# Patient Record
Sex: Female | Born: 1975
Health system: Southern US, Community
[De-identification: ages and names within clinical notes are randomized; demographics above are authoritative.]

## PROBLEM LIST (undated history)

## (undated) DIAGNOSIS — J309 Allergic rhinitis, unspecified: Secondary | ICD-10-CM

## (undated) HISTORY — DX: Allergic rhinitis, unspecified: J30.9

## (undated) HISTORY — PX: TUBAL LIGATION: SHX77

---

## 1998-01-22 ENCOUNTER — Other Ambulatory Visit: Admission: RE | Admit: 1998-01-22 | Discharge: 1998-01-22 | Payer: Self-pay | Admitting: *Deleted

## 1998-03-22 ENCOUNTER — Ambulatory Visit (HOSPITAL_COMMUNITY): Admission: RE | Admit: 1998-03-22 | Discharge: 1998-03-22 | Payer: Self-pay | Admitting: *Deleted

## 1998-06-19 ENCOUNTER — Inpatient Hospital Stay (HOSPITAL_COMMUNITY): Admission: AD | Admit: 1998-06-19 | Discharge: 1998-06-19 | Payer: Self-pay | Admitting: *Deleted

## 1998-06-20 ENCOUNTER — Inpatient Hospital Stay (HOSPITAL_COMMUNITY): Admission: AD | Admit: 1998-06-20 | Discharge: 1998-06-22 | Payer: Self-pay | Admitting: *Deleted

## 2013-03-18 ENCOUNTER — Ambulatory Visit: Payer: No Typology Code available for payment source | Attending: Family Medicine | Admitting: Family Medicine

## 2013-03-18 ENCOUNTER — Other Ambulatory Visit: Payer: Self-pay | Admitting: Family Medicine

## 2013-03-18 VITALS — BP 115/79 | HR 74 | Temp 98.4°F | Resp 16 | Wt 151.4 lb

## 2013-03-18 DIAGNOSIS — B3731 Acute candidiasis of vulva and vagina: Secondary | ICD-10-CM

## 2013-03-18 DIAGNOSIS — Z0000 Encounter for general adult medical examination without abnormal findings: Secondary | ICD-10-CM

## 2013-03-18 DIAGNOSIS — N920 Excessive and frequent menstruation with regular cycle: Secondary | ICD-10-CM

## 2013-03-18 DIAGNOSIS — Z7251 High risk heterosexual behavior: Secondary | ICD-10-CM

## 2013-03-18 DIAGNOSIS — B373 Candidiasis of vulva and vagina: Secondary | ICD-10-CM

## 2013-03-18 DIAGNOSIS — B379 Candidiasis, unspecified: Secondary | ICD-10-CM | POA: Insufficient documentation

## 2013-03-18 LAB — POCT GLYCOSYLATED HEMOGLOBIN (HGB A1C): Hemoglobin A1C: 5.6

## 2013-03-18 MED ORDER — FLUCONAZOLE 150 MG PO TABS: 150.0000 mg | ORAL_TABLET | Freq: Once | ORAL | Status: DC

## 2013-03-18 NOTE — Progress Notes (Signed)
Subjective:     Patient ID: Heidi Ellis, female   DOB: [birth year], 37 y.o.   MRN: [id number]  HPI Pt here to establish care and for health maintanance. She denies any health problems and takes no medications. Immigrated from [place] 16 years ago. G3P3 all NSVDs without complications.  She does report heavy periods - 4 day cycle q28 days, 2-3 days heavy with occasional clots. Significant cramps, mostly relieved by midol. Was on iron in the past for anemia, took for 1 year.   She has slight vaginal itching today, present for the past week. Has had before and treated at home with monistat. Seems to come on if she wears non-cotton underwear.   2 years ago divorced from her husband after finding out he was cheating on her. Would like to be checked for STI's. Has not had any pelvic pain or increased discharge. No other specific si/sx of STIs.   Pap - has gotten them roughly every 3 years, never had any abnormalities.   Not sure on immunization status - thinks they are utd from pregnancies but does not have records.   No FH of breast/ovarian/colon cancer.   Nonsmoker, nondrinker, no drug use.   Exercises sporadically, nothing regular.   Diet - minimal vegetables, otherwise all food groups, tries to "eat healthy"    Review of Systems  Respiratory: Negative for cough and shortness of breath.   Cardiovascular: Negative for chest pain.  Genitourinary: Positive for vaginal discharge. Negative for dysuria and vaginal bleeding.  Neurological: Negative for dizziness and headaches.        Objective:   Physical Exam  Nursing note and vitals reviewed. Constitutional: She appears well-developed and well-nourished.  HENT:  Right Ear: External ear normal.  Left Ear: External ear normal.  Mouth/Throat: Oropharynx is clear and moist.  Neck: Normal range of motion. Neck supple. No thyromegaly present.  Cardiovascular: Normal rate, regular rhythm and normal heart sounds.   Pulmonary/Chest: Effort  normal and breath sounds normal.  Abdominal: Soft. There is no tenderness.  Genitourinary: Uterus normal. Vaginal discharge found.  Skin: Skin is warm and dry.  Psychiatric: She has a normal mood and affect.       Assessment:     High risk sexual behavior - Plan: GC/Chlamydia Amp Probe, Genital, HIV Antibody, RPR, Acute Hep Panel & Hep B Surface Ab, Wet prep, genital  Heavy periods - Plan: CBC with Differential, Obtain medical records  Health care maintenance - Plan: CBC with Differential, Hemoglobin A1c, TSH, Vitamin D, 25-hydroxy, Comprehensive metabolic panel, Obtain medical records, Lipid Panel, PAP, Thin Prep w/HPV rflx HPV Type 16/18 ([facility])  Vaginal candida - Plan: fluconazole (DIFLUCAN) 150 MG tablet       Plan:     Potential exposure to STIs - wet prep, gc/chalm, hep, rpr, hiv today  Heavy periods - cbc, if anemic will get tvus to eval endo stripe and for fibroids. discussed ocps - she is s/p TL and in the past when she took ocps she has headaches and general malaise - prefers to avoid.   Candidiasis - diflucan x1  HCM - basic labs, obtain records for immunization status, pap with hpv today so as long as wnl will not need again for 5 years per curent guidelines. mammo starting at age 70, c-scope staring at age 56. Increase exercise.   rtc 1 year HCM exam, earlier if needed. Call with any questions or concerns.

## 2013-03-18 NOTE — Progress Notes (Signed)
Patient states her for her physical  And pap smear

## 2013-03-19 LAB — CBC WITH DIFFERENTIAL/PLATELET
Eosinophils Relative: 2 % (ref 0–5)
HCT: 32.7 % — ABNORMAL LOW (ref 36.0–46.0)
Lymphocytes Relative: 26 % (ref 12–46)
Lymphs Abs: 1.6 10*3/uL (ref 0.7–4.0)
MCV: 72.5 fL — ABNORMAL LOW (ref 78.0–100.0)
Monocytes Absolute: 0.4 10*3/uL (ref 0.1–1.0)
RBC: 4.51 MIL/uL (ref 3.87–5.11)
RDW: 17.5 % — ABNORMAL HIGH (ref 11.5–15.5)
WBC: 6.1 10*3/uL (ref 4.0–10.5)

## 2013-03-19 LAB — COMPREHENSIVE METABOLIC PANEL
Albumin: 3.9 g/dL (ref 3.5–5.2)
Alkaline Phosphatase: 59 U/L (ref 39–117)
BUN: 14 mg/dL (ref 6–23)
Glucose, Bld: 85 mg/dL (ref 70–99)
Total Bilirubin: 0.5 mg/dL (ref 0.3–1.2)

## 2013-03-19 LAB — RPR

## 2013-03-19 LAB — VITAMIN D 25 HYDROXY (VIT D DEFICIENCY, FRACTURES): Vit D, 25-Hydroxy: 36 ng/mL (ref 30–89)

## 2013-03-19 LAB — ACUTE HEP PANEL AND HEP B SURFACE AB
Hep B C IgM: NEGATIVE
Hep B S Ab: NONREACTIVE

## 2013-03-19 LAB — GC/CHLAMYDIA PROBE AMP: CT Probe RNA: NEGATIVE

## 2013-03-21 ENCOUNTER — Telehealth: Payer: Self-pay | Admitting: *Deleted

## 2013-03-21 ENCOUNTER — Other Ambulatory Visit: Payer: Self-pay | Admitting: Family Medicine

## 2013-03-21 DIAGNOSIS — D509 Iron deficiency anemia, unspecified: Secondary | ICD-10-CM

## 2013-03-21 LAB — WET PREP BY MOLECULAR PROBE: Trichomonas vaginosis: NEGATIVE

## 2013-03-21 NOTE — Telephone Encounter (Signed)
03/21/13 Patient made aware of lab results anemic doctor has ordered a pelic Ultrasound to evaluate her uteru per Dr. Lucretia Roers also start taking Ferrous Gluconate with orange juice you  Can increase fiber or use a stool   softner as iron may be constipating. Verbalize and understand P.[facility] BSN MHA

## 2013-03-22 ENCOUNTER — Encounter: Payer: Self-pay | Admitting: Family Medicine

## 2013-03-22 LAB — PAP, THIN PREP W/HPV RFLX HPV TYPE 16/18: HPV DNA High Risk: NOT DETECTED

## 2013-03-23 ENCOUNTER — Ambulatory Visit (HOSPITAL_COMMUNITY): Payer: No Typology Code available for payment source

## 2013-03-24 ENCOUNTER — Ambulatory Visit: Payer: No Typology Code available for payment source | Attending: Family Medicine

## 2013-03-24 DIAGNOSIS — Z0000 Encounter for general adult medical examination without abnormal findings: Secondary | ICD-10-CM

## 2013-03-24 LAB — LIPID PANEL
HDL: 44 mg/dL (ref >39)
LDL Cholesterol: 89 mg/dL (ref 0–99)
Triglycerides: 127 mg/dL (ref <150)
VLDL: 25 mg/dL (ref 0–40)

## 2013-03-25 ENCOUNTER — Ambulatory Visit (HOSPITAL_COMMUNITY): Payer: No Typology Code available for payment source

## 2013-03-25 ENCOUNTER — Encounter: Payer: Self-pay | Admitting: Family Medicine

## 2013-03-28 ENCOUNTER — Ambulatory Visit (HOSPITAL_COMMUNITY)
Admission: RE | Admit: 2013-03-28 | Discharge: 2013-03-28 | Disposition: A | Discharge status: Home / Self Care | Payer: No Typology Code available for payment source | Source: Ambulatory Visit | Attending: Family Medicine | Admitting: Family Medicine

## 2013-03-28 DIAGNOSIS — D649 Anemia, unspecified: Secondary | ICD-10-CM | POA: Insufficient documentation

## 2013-03-28 DIAGNOSIS — N920 Excessive and frequent menstruation with regular cycle: Secondary | ICD-10-CM | POA: Insufficient documentation

## 2013-03-28 DIAGNOSIS — R9389 Abnormal findings on diagnostic imaging of other specified body structures: Secondary | ICD-10-CM | POA: Insufficient documentation

## 2013-03-28 DIAGNOSIS — D509 Iron deficiency anemia, unspecified: Secondary | ICD-10-CM

## 2013-03-29 ENCOUNTER — Other Ambulatory Visit: Payer: Self-pay | Admitting: Family Medicine

## 2013-03-29 ENCOUNTER — Telehealth: Payer: Self-pay | Admitting: *Deleted

## 2013-03-29 DIAGNOSIS — R935 Abnormal findings on diagnostic imaging of other abdominal regions, including retroperitoneum: Secondary | ICD-10-CM

## 2013-03-29 NOTE — Telephone Encounter (Signed)
03/29/13 Unable to reach patient at number provideded. P.Faithe Ariola,RN

## 2013-04-26 ENCOUNTER — Telehealth: Payer: Self-pay | Admitting: Family Medicine

## 2013-04-26 NOTE — Telephone Encounter (Signed)
Pt left voicemail about dental referral, not sure when voicemail is from issue could be resolved.  Please f/u with patient.

## 2013-05-04 ENCOUNTER — Ambulatory Visit (INDEPENDENT_AMBULATORY_CARE_PROVIDER_SITE_OTHER): Payer: No Typology Code available for payment source | Admitting: Obstetrics & Gynecology

## 2013-05-04 ENCOUNTER — Other Ambulatory Visit (HOSPITAL_COMMUNITY)
Admission: RE | Admit: 2013-05-04 | Discharge: 2013-05-04 | Disposition: A | Discharge status: Home / Self Care | Payer: No Typology Code available for payment source | Source: Ambulatory Visit | Attending: Obstetrics & Gynecology | Admitting: Obstetrics & Gynecology

## 2013-05-04 ENCOUNTER — Encounter: Payer: Self-pay | Admitting: Obstetrics & Gynecology

## 2013-05-04 VITALS — BP 118/78 | HR 72 | Temp 98.0°F | Ht 63.0 in | Wt 151.1 lb

## 2013-05-04 DIAGNOSIS — N760 Acute vaginitis: Secondary | ICD-10-CM

## 2013-05-04 DIAGNOSIS — N920 Excessive and frequent menstruation with regular cycle: Secondary | ICD-10-CM | POA: Insufficient documentation

## 2013-05-04 DIAGNOSIS — N946 Dysmenorrhea, unspecified: Secondary | ICD-10-CM

## 2013-05-04 DIAGNOSIS — Z01812 Encounter for preprocedural laboratory examination: Secondary | ICD-10-CM

## 2013-05-04 LAB — POCT PREGNANCY, URINE: Preg Test, Ur: NEGATIVE

## 2013-05-04 MED ORDER — IBUPROFEN 800 MG PO TABS: 800.0000 mg | ORAL_TABLET | Freq: Three times a day (TID) | ORAL | Status: DC | PRN

## 2013-05-04 NOTE — Progress Notes (Signed)
Sent here for thickened endometrial lining. C/o heavy, painful periods. Also c/o may have yeast infection again- has them frequently.

## 2013-05-04 NOTE — Patient Instructions (Signed)
Menorragia (Menorrhagia) La hemorragia uterina (sangrado del tero) disfuncional es diferente del perodo menstrual normal. Cuando los perodos son irregulares o hay ms hemorragia que lo habitual en usted, se denomina menorragia. La causa puede ser un desequilibrio hormonal, fsico o metablico u otros problemas. Es necesario Medical sales representative examen para que el profesional pueda tratar [employer] causas que son tratables. Si el problema contina, ser necesario realizar una dilatacin y curetaje. Este procedimiento [employer] en dilatar el cuello del tero (la apertura del tero o matriz), es decir, se lo estira para Technical brewer, y se raspa la superficie que tapiza el interior del tero. Un especialista (patlogo) examina en el microscopio el tejido que se retira para asegurarse que no haya nada preocupante que requiera un examen ms exhaustivo. INSTRUCCIONES PARA EL CUIDADO DOMICILIARIO  Si el profesional que la asiste le prescribe medicamentos, tmelos tal como se le indic. No cambie ni reemplace medicamentos sin consultarlo con Mining engineer.  Las hemorragias de larga duracin pueden tener como consecuencia un dficit de hierro. El profesional que la asiste podr prescribirle comprimidos de hierro. Esto ayuda a Scientific laboratory technician que el organismo pierde luego de una hemorragia abundante. Tome los medicamentos tal como se le indic. El hierro puede causarle estreimiento. Si esto es un problema, aumente el consumo de [place], frutas y Leaf.  No tome aspirina o medicamentos que la contengan desde una semana antes del perodo menstrual ni durante el mismo. La aspirina puede hacer que la hemorragia empeore.  Si necesita cambiar el apsito o el tampn mas de una vez cada 2 horas, permanezca en cama y descanse todo lo posible hasta que la hemorragia se detenga.  Consuma alimentos balanceados. Coma alimentos ricos en hierro. Por ejemplo, verduras de [employer], carne, hgado, huevos y panes y Medical laboratory scientific officer de  grano integral. No trate de perder peso hasta que la hemorragia anormal se detenga y los niveles de hierro en la sangre vuelvan a la normalidad. SOLICITE ATENCIN MDICA SI:  Debe cambiar el apsito o el tampn ms de una vez cada hora.  Si tiene nuseas (ganas de vomitar), vmitos, mareos o diarrea mientras toma los medicamentos.  Tiene algn problema que pueda relacionarse con el medicamento que est tomando. SOLICITE ATENCIN MDICA DE INMEDIATO SI:  Tiene fiebre.  Siente escalofros.  Sufre una hemorragia intensa o comienza a eliminar cogulos de [place].  Se siente mareado o sufre un desmayo. EST SEGURO QUE:   Comprende las instrucciones para el alta mdica.  Controlar su enfermedad.  Solicitar atencin mdica de inmediato segn las indicaciones. Document Released: 07/09/2005 Document Revised: 12/22/2011 [facility] Patient Information 2014 [place], [place].

## 2013-05-05 LAB — WET PREP, GENITAL
Clue Cells Wet Prep HPF POC: NONE SEEN
Yeast Wet Prep HPF POC: NONE SEEN

## 2013-05-05 NOTE — Progress Notes (Signed)
GYNECOLOGY CLINIC PROGRESS NOTE  History:  37 y.o. G3P3000 here today for evaluation of abnormal endometrial stripe seen on ultrasound during workup for menorrhagia; see report below.  Reports menorrhagia for several months, no symptoms of anemia.  She also reports having pruritic vaginal discharge.  No other GYN concerns.   03/21/13 TRANSABDOMINAL AND TRANSVAGINAL ULTRASOUND OF PELVIS Clinical Data: Anemia. Menorrhagia.  Comparison: None  Findings: Uterus: 8.4 x 4.6 x 5.3 cm. Endometrium: 6.5 mm in thickness. The endometrium is irregular and heterogeneous. There are echogenic foci in the fundal endometrium. Right ovary: Normal appearance/no adnexal mass.  Left ovary: Could not be visualized. Other findings: No free fluid IMPRESSION: Left ovary could not be visualized. The endometrium is abnormal. It is heterogeneous with calcified foci. Underlying calcified lesion cannot be excluded. Sonohysterogram or MRI may be helpful.   The following portions of the patient's history were reviewed and updated as appropriate: allergies, current medications, past family history, past medical history, past social history, past surgical history and problem list.  Review of Systems:  Pertinent items are noted in HPI.  Objective:  Physical Exam BP 118/78  Pulse 72  Temp(Src) 98 F (36.7 C)  Ht 5\' 3"  (1.6 m)  Wt 151 lb 1.6 oz (68.539 kg)  BMI 26.77 kg/m2  LMP 04/20/2013 Gen: NAD Abd: Soft, nontender and nondistended Pelvic: Normal appearing external genitalia; normal appearing vaginal mucosa and cervix.  Scant white discharge seen, sample obtained for wet prep.  Small uterus, no other palpable masses, no uterine or adnexal tenderness  ENDOMETRIAL BIOPSY     The indications for endometrial biopsy were reviewed.   Risks of the biopsy including cramping, bleeding, infection, uterine perforation, inadequate specimen and need for additional procedures  were discussed. The patient states she understands and agrees  to undergo procedure today. Consent was signed. Time out was performed. Urine HCG was negative. During the pelvic exam, the cervix was prepped with Betadine.  The 3 mm pipelle was introduced into the endometrial cavity without difficulty to a depth of 8 cm, and a moderate amount of tissue was obtained and sent to pathology. The instruments were removed from the patient's vagina. Minimal bleeding from the cervix was noted. The patient tolerated the procedure well. Routine post-procedure instructions were given to the patient.    Assessment & Plan:  The patient will be called with the results of the wet prep and endometrial biopsy and given recommendations for further management.   Bleeding precautions reviewed. Routine preventative health maintenance measures emphasized.

## 2013-05-06 ENCOUNTER — Telehealth: Payer: Self-pay | Admitting: General Practice

## 2013-05-06 NOTE — Telephone Encounter (Signed)
Called patient and informed her of normal results. Patient verbalized understanding and had no further questions  

## 2013-05-06 NOTE — Telephone Encounter (Signed)
Message copied by Kathee Delton on Fri May 06, 2013  9:36 AM ------      Message from: Jaynie Collins A      Created: Thu May 05, 2013  4:16 PM       Normal wet prep. No need for treatment. Please call to inform patient of results.       ------

## 2013-05-09 ENCOUNTER — Telehealth: Payer: Self-pay | Admitting: General Practice

## 2013-05-09 NOTE — Telephone Encounter (Signed)
Message copied by Kathee Delton on Mon May 09, 2013  1:02 PM ------      Message from: Jaynie Collins A      Created: Sat May 07, 2013  9:11 AM       Benign endometrial biopsy. Please call to inform patient of results. ------

## 2013-05-09 NOTE — Telephone Encounter (Signed)
Called patient, went straight to VM- left message stating we are calling with some information and to call us back at the clinics

## 2013-05-11 NOTE — Telephone Encounter (Signed)
Pt informed of results.

## 2013-10-06 ENCOUNTER — Emergency Department (HOSPITAL_COMMUNITY)
Admission: EM | Admit: 2013-10-06 | Discharge: 2013-10-06 | Disposition: A | Discharge status: Home / Self Care | Payer: Self-pay | Attending: Emergency Medicine | Admitting: Emergency Medicine

## 2013-10-06 ENCOUNTER — Encounter (HOSPITAL_COMMUNITY): Payer: Self-pay | Admitting: Emergency Medicine

## 2013-10-06 DIAGNOSIS — R5381 Other malaise: Secondary | ICD-10-CM | POA: Insufficient documentation

## 2013-10-06 DIAGNOSIS — R6889 Other general symptoms and signs: Secondary | ICD-10-CM

## 2013-10-06 DIAGNOSIS — R110 Nausea: Secondary | ICD-10-CM | POA: Insufficient documentation

## 2013-10-06 DIAGNOSIS — J069 Acute upper respiratory infection, unspecified: Secondary | ICD-10-CM

## 2013-10-06 DIAGNOSIS — R000 Tachycardia, unspecified: Secondary | ICD-10-CM | POA: Insufficient documentation

## 2013-10-06 DIAGNOSIS — R51 Headache: Secondary | ICD-10-CM | POA: Insufficient documentation

## 2013-10-06 DIAGNOSIS — R6883 Chills (without fever): Secondary | ICD-10-CM | POA: Insufficient documentation

## 2013-10-06 MED ORDER — OSELTAMIVIR PHOSPHATE 75 MG PO CAPS: 75.0000 mg | ORAL_CAPSULE | Freq: Two times a day (BID) | ORAL | Status: DC

## 2013-10-06 NOTE — ED Provider Notes (Signed)
CSN: [id number]     Arrival date & time 10/06/13  1316 History  This chart was scribed for non-physician practitioner, Johnnette Gourd, PA-C working with Glynn Octave, MD by Greggory Stallion, ED scribe. This patient was seen in room TR08C/TR08C and the patient's care was started at 1:32 PM.   Chief Complaint  Patient presents with  . Generalized Body Aches   The history is provided by the patient. No language interpreter was used.   HPI Comments: Heidi Ellis is a 37 y.o. female who presents to the Emergency Department complaining of generalized body aches, cough, congestion, headache and chills that started 2 days ago. She has also had mild nausea. Pt has taken Mucinex with temporary relief of headache. Denies fever, emesis. Denies sick contacts. Pt has not gotten a flu shot this year.   History reviewed. No pertinent past medical history. History reviewed. No pertinent past surgical history. History reviewed. No pertinent family history. History  Substance Use Topics  . Smoking status: Never Smoker   . Smokeless tobacco: Never Used  . Alcohol Use: No   OB History   Grav Para Term Preterm Abortions TAB SAB Ect Mult Living   3 3 3             Review of Systems  Constitutional: Positive for chills and fatigue. Negative for fever.  HENT: Positive for congestion.   Respiratory: Positive for cough.   Gastrointestinal: Positive for nausea. Negative for vomiting.  Neurological: Positive for headaches.  All other systems reviewed and are negative.    Allergies  Review of patient's allergies indicates no known allergies.  Home Medications   Current Outpatient Rx  Name  Route  Sig  Dispense  Refill  . guaiFENesin (MUCINEX) 600 MG 12 hr tablet   Oral   Take 1,200 mg by mouth 2 (two) times daily as needed for cough.         Marland Kitchen oseltamivir (TAMIFLU) 75 MG capsule   Oral   Take 1 capsule (75 mg total) by mouth every 12 (twelve) hours.   10 capsule   0    BP 124/81  Pulse  112  Temp(Src) 98.3 F (36.8 C) (Oral)  Resp 24  SpO2 97%  Physical Exam  Nursing note and vitals reviewed. Constitutional: She is oriented to person, place, and time. She appears well-developed and well-nourished. No distress.  Nasal congestion.   HENT:  Head: Normocephalic and atraumatic.  Nose: Mucosal edema present. Right sinus exhibits maxillary sinus tenderness. Right sinus exhibits no frontal sinus tenderness. Left sinus exhibits maxillary sinus tenderness. Left sinus exhibits no frontal sinus tenderness.  Mouth/Throat: Oropharynx is clear and moist.  Eyes: Conjunctivae and EOM are normal.  Neck: Normal range of motion. Neck supple.  Cardiovascular: Regular rhythm and normal heart sounds.  Tachycardia present.   Pulmonary/Chest: Effort normal and breath sounds normal. No respiratory distress. She has no wheezes. She has no rhonchi. She has no rales.  Musculoskeletal: Normal range of motion. She exhibits no edema.  Neurological: She is alert and oriented to person, place, and time. No sensory deficit.  Skin: Skin is warm and dry.  Psychiatric: She has a normal mood and affect. Her behavior is normal.    ED Course  Procedures (including critical care time)  DIAGNOSTIC STUDIES: Oxygen Saturation is 97% on RA, normal by my interpretation.    COORDINATION OF CARE: 1:35 PM-Discussed treatment plan which includes continuing Mucinex and ibuprofen with pt at bedside and pt agreed to plan.  Labs Review Labs Reviewed - No data to display Imaging Review No results found.  EKG Interpretation   None       MDM   1. Flu-like symptoms   2. URI (upper respiratory infection)    Patient presents with flulike symptoms. She is well appearing and in no apparent distress. Tachycardic around 112. Given symptoms have been present for less than 72 hours, will start Tamiflu. Symptomatic treatment discussed. Return precautions given. Patient states understanding of treatment care plan  and is agreeable.  I personally performed the services described in this documentation, which was scribed in my presence. The recorded information has been reviewed and is accurate.   Trevor Mace, PA-C 10/06/13 1342

## 2013-10-06 NOTE — ED Notes (Signed)
Pt from home, c/o body aches, head congestion and chills x 2 days

## 2013-10-07 NOTE — ED Provider Notes (Signed)
Medical screening examination/treatment/procedure(s) were performed by non-physician practitioner and as supervising physician I was immediately available for consultation/collaboration.  EKG Interpretation   None        Magdelena Kinsella, MD 10/07/13 1000 

## 2013-11-21 ENCOUNTER — Ambulatory Visit: Payer: No Typology Code available for payment source | Attending: Internal Medicine

## 2013-11-21 ENCOUNTER — Ambulatory Visit: Payer: No Typology Code available for payment source | Attending: Internal Medicine | Admitting: Internal Medicine

## 2013-11-21 VITALS — BP 143/75 | HR 90 | Temp 98.9°F | Resp 14 | Ht 63.0 in | Wt 151.2 lb

## 2013-11-21 DIAGNOSIS — D649 Anemia, unspecified: Secondary | ICD-10-CM | POA: Insufficient documentation

## 2013-11-21 DIAGNOSIS — N920 Excessive and frequent menstruation with regular cycle: Secondary | ICD-10-CM | POA: Insufficient documentation

## 2013-11-21 DIAGNOSIS — R12 Heartburn: Secondary | ICD-10-CM | POA: Insufficient documentation

## 2013-11-21 DIAGNOSIS — K5900 Constipation, unspecified: Secondary | ICD-10-CM | POA: Insufficient documentation

## 2013-11-21 DIAGNOSIS — N946 Dysmenorrhea, unspecified: Secondary | ICD-10-CM | POA: Insufficient documentation

## 2013-11-21 DIAGNOSIS — J309 Allergic rhinitis, unspecified: Secondary | ICD-10-CM

## 2013-11-21 LAB — CBC WITH DIFFERENTIAL/PLATELET
Basophils Absolute: 0.1 10*3/uL (ref 0.0–0.1)
Basophils Relative: 1 % (ref 0–1)
EOS ABS: 0.1 10*3/uL (ref 0.0–0.7)
EOS PCT: 2 % (ref 0–5)
HEMATOCRIT: 36.7 % (ref 36.0–46.0)
Hemoglobin: 12.1 g/dL (ref 12.0–15.0)
LYMPHS ABS: 1.1 10*3/uL (ref 0.7–4.0)
LYMPHS PCT: 21 % (ref 12–46)
MCH: 28.1 pg (ref 26.0–34.0)
MCHC: 33 g/dL (ref 30.0–36.0)
MCV: 85.2 fL (ref 78.0–100.0)
MONO ABS: 0.4 10*3/uL (ref 0.1–1.0)
MONOS PCT: 7 % (ref 3–12)
Neutro Abs: 3.4 10*3/uL (ref 1.7–7.7)
Neutrophils Relative %: 69 % (ref 43–77)
PLATELETS: 288 10*3/uL (ref 150–400)
RBC: 4.31 MIL/uL (ref 3.87–5.11)
RDW: 14.6 % (ref 11.5–15.5)
WBC: 5 10*3/uL (ref 4.0–10.5)

## 2013-11-21 LAB — COMPLETE METABOLIC PANEL WITH GFR
ALT: 8 U/L (ref 0–35)
AST: 11 U/L (ref 0–37)
Albumin: 3.8 g/dL (ref 3.5–5.2)
Alkaline Phosphatase: 51 U/L (ref 39–117)
BILIRUBIN TOTAL: 0.6 mg/dL (ref 0.2–1.2)
BUN: 11 mg/dL (ref 6–23)
CALCIUM: 8.8 mg/dL (ref 8.4–10.5)
CHLORIDE: 105 meq/L (ref 96–112)
CO2: 25 meq/L (ref 19–32)
CREATININE: 0.66 mg/dL (ref 0.50–1.10)
GLUCOSE: 87 mg/dL (ref 70–99)
Potassium: 4 mEq/L (ref 3.5–5.3)
Sodium: 139 mEq/L (ref 135–145)
Total Protein: 6.5 g/dL (ref 6.0–8.3)

## 2013-11-21 LAB — LIPID PANEL
Cholesterol: 172 mg/dL (ref 0–200)
HDL: 42 mg/dL (ref >39)
LDL CALC: 92 mg/dL (ref 0–99)
TRIGLYCERIDES: 190 mg/dL — AB (ref <150)
Total CHOL/HDL Ratio: 4.1 Ratio
VLDL: 38 mg/dL (ref 0–40)

## 2013-11-21 LAB — TSH: TSH: 0.634 u[IU]/mL (ref 0.350–4.500)

## 2013-11-21 MED ORDER — LORATADINE 10 MG PO TABS: 10.0000 mg | ORAL_TABLET | Freq: Every day | ORAL | Status: DC

## 2013-11-21 MED ORDER — POLYETHYLENE GLYCOL 3350 17 GM/SCOOP PO POWD: 17.0000 g | Freq: Two times a day (BID) | ORAL | Status: DC | PRN

## 2013-11-21 MED ORDER — AMOXICILLIN-POT CLAVULANATE 875-125 MG PO TABS: 1.0000 | ORAL_TABLET | Freq: Two times a day (BID) | ORAL | Status: DC

## 2013-11-21 MED ORDER — PANTOPRAZOLE SODIUM 40 MG PO TBEC: 40.0000 mg | DELAYED_RELEASE_TABLET | Freq: Every day | ORAL | Status: DC

## 2013-11-21 NOTE — Progress Notes (Signed)
Pt is here for an office visit. Pt requests lab results from last visit, a vitamin D check, and a dentist referral. Complains of seasonal allergies; runny nose, sore throat, headaches. Requests medication for allergies.

## 2013-11-21 NOTE — Progress Notes (Signed)
Patient ID: Heidi Ellis, female   DOB: 1975/11/07, 38 y.o.   MRN: [id number]   CC:  HPI: 38 year old female here to establish care. The patient states that she is anemic and takes iron pills. She has irregular menstrual cycles with heavy flow on the first couple of days of her period. She had a Pap smear, endometrial biopsy which was negative in June of 2014. She also is requesting a dentist referral because of pain in her right molar She also complains of seasonal allergies and runny nose teary eyes, occasional cough She also states that she is constipated and has a BM every other day, feels bloated and has heartburn. She takes Gas-X over-the-counter for heartburn    No Known Allergies No past medical history on file. Current Outpatient Prescriptions on File Prior to Visit  Medication Sig Dispense Refill  . guaiFENesin (MUCINEX) 600 MG 12 hr tablet Take 1,200 mg by mouth 2 (two) times daily as needed for cough.       No current facility-administered medications on file prior to visit.   No family history on file. History   Social History  . Marital Status: Single    Spouse Name: N/A    Number of Children: N/A  . Years of Education: N/A   Occupational History  . Not on file.   Social History Main Topics  . Smoking status: Never Smoker   . Smokeless tobacco: Never Used  . Alcohol Use: No  . Drug Use: No  . Sexual Activity: Not Currently    Birth Control/ Protection: None   Other Topics Concern  . Not on file   Social History Narrative  . No narrative on file    Review of Systems  Constitutional: As in history of present illness  HENT: Negative for ear pain, nosebleeds, congestion, facial swelling, rhinorrhea, neck pain, neck stiffness and ear discharge.   Eyes: Negative for pain, discharge, redness, itching and visual disturbance.  Respiratory: Negative for cough, choking, chest tightness, shortness of breath, wheezing and stridor.   Cardiovascular: Negative  for chest pain, palpitations and leg swelling.  Gastrointestinal: As in history of present illness Genitourinary: Negative for dysuria, urgency, frequency, hematuria, flank pain, decreased urine volume, difficulty urinating and dyspareunia.  Musculoskeletal: Negative for back pain, joint swelling, arthralgias and gait problem.  Neurological: Negative for dizziness, tremors, seizures, syncope, facial asymmetry, speech difficulty, weakness, light-headedness, numbness and headaches.  Hematological: Negative for adenopathy. Does not bruise/bleed easily.  Psychiatric/Behavioral: Negative for hallucinations, behavioral problems, confusion, dysphoric mood, decreased concentration and agitation.    Objective:   Filed Vitals:   11/21/13 0940  BP: 143/75  Pulse: 90  Temp: 98.9 F (37.2 C)  Resp: 14    Physical Exam  Constitutional: Appears well-developed and well-nourished. No distress.  HENT: Normocephalic. External right and left ear normal. Oropharynx is clear and moist.  Eyes: Conjunctivae and EOM are normal. PERRLA, no scleral icterus.  Neck: Normal ROM. Neck supple. No JVD. No tracheal deviation. No thyromegaly.  CVS: RRR, S1/S2 +, no murmurs, no gallops, no carotid bruit.  Pulmonary: Effort and breath sounds normal, no stridor, rhonchi, wheezes, rales.  Abdominal: Soft. BS +,  no distension, tenderness, rebound or guarding.  Musculoskeletal: Normal range of motion. No edema and no tenderness.  Lymphadenopathy: No lymphadenopathy noted, cervical, inguinal. Neuro: Alert. Normal reflexes, muscle tone coordination. No cranial nerve deficit. Skin: Skin is warm and dry. No rash noted. Not diaphoretic. No erythema. No pallor.  Psychiatric: Normal mood and affect.  Behavior, judgment, thought content normal.   Lab Results  Component Value Date   WBC 6.1 03/18/2013   HGB 10.1* 03/18/2013   HCT 32.7* 03/18/2013   MCV 72.5* 03/18/2013   PLT 316 03/18/2013   Lab Results  Component Value Date    CREATININE 0.72 03/18/2013   BUN 14 03/18/2013   NA 135 03/18/2013   K 4.3 03/18/2013   CL 104 03/18/2013   CO2 25 03/18/2013    Lab Results  Component Value Date   HGBA1C 5.6 % 03/18/2013   Lipid Panel     Component Value Date/Time   CHOL 158 03/24/2013 1006   TRIG 127 03/24/2013 1006   HDL 44 03/24/2013 1006   CHOLHDL 3.6 03/24/2013 1006   VLDL 25 03/24/2013 1006   LDLCALC 89 03/24/2013 1006       Assessment and plan:   Patient Active Problem List   Diagnosis Date Noted  . Dysmenorrhea 05/04/2013  . Menorrhagia 05/04/2013   Allergic rhinitis Prescribed the patient Claritin If symptoms persist. The patient on Flonase   Dentist referral Probably has a chipped molar We'll start the patient on Augmentin before her appointment   Constipation We'll check TSH, start the patient on MiraLAX Start The patient on Protonix for heartburn  Establish care Obtain lipid panel, CBC Due for Pap smear in June of 2015 Continue iron pills for anemia   Follow up in 2 to 3 months         The patient was given clear instructions to go to ER or return to medical center if symptoms don't improve, worsen or new problems develop. The patient verbalized understanding. The patient was told to call to get any lab results if not heard anything in the next week.

## 2013-11-22 ENCOUNTER — Telehealth: Payer: Self-pay | Admitting: *Deleted

## 2013-11-22 LAB — VITAMIN D 25 HYDROXY (VIT D DEFICIENCY, FRACTURES): Vit D, 25-Hydroxy: 38 ng/mL (ref 30–89)

## 2013-11-22 NOTE — Telephone Encounter (Signed)
Message copied by Jamyrah Saur, [place] on Tue Nov 22, 2013  4:06 PM ------      Message from: Susie CassetteABROL MD, Germain OsgoodNAYANA      Created: Tue Nov 22, 2013  2:59 PM       Notify patient that all labs are normal ------

## 2013-12-27 ENCOUNTER — Encounter (HOSPITAL_COMMUNITY): Payer: Self-pay | Admitting: Emergency Medicine

## 2013-12-27 ENCOUNTER — Emergency Department (HOSPITAL_COMMUNITY)
Admission: EM | Admit: 2013-12-27 | Discharge: 2013-12-27 | Disposition: A | Discharge status: Home / Self Care | Payer: No Typology Code available for payment source | Source: Home / Self Care | Attending: Family Medicine | Admitting: Family Medicine

## 2013-12-27 DIAGNOSIS — R519 Headache, unspecified: Secondary | ICD-10-CM

## 2013-12-27 DIAGNOSIS — J069 Acute upper respiratory infection, unspecified: Secondary | ICD-10-CM

## 2013-12-27 DIAGNOSIS — Z889 Allergy status to unspecified drugs, medicaments and biological substances status: Secondary | ICD-10-CM

## 2013-12-27 DIAGNOSIS — R51 Headache: Secondary | ICD-10-CM

## 2013-12-27 DIAGNOSIS — B9789 Other viral agents as the cause of diseases classified elsewhere: Principal | ICD-10-CM

## 2013-12-27 MED ORDER — FLUTICASONE PROPIONATE 50 MCG/ACT NA SUSP: 1.0000 | Freq: Every day | NASAL | Status: DC

## 2013-12-27 MED ORDER — OLOPATADINE HCL 0.2 % OP SOLN: 1.0000 [drp] | Freq: Every day | OPHTHALMIC | Status: DC

## 2013-12-27 MED ORDER — IPRATROPIUM BROMIDE 0.06 % NA SOLN: 2.0000 | Freq: Four times a day (QID) | NASAL | Status: DC

## 2013-12-27 NOTE — ED Provider Notes (Signed)
CSN: [id number]     Arrival date & time 12/27/13  1548 History   First MD Initiated Contact with Patient 12/27/13 1708     Chief Complaint  Patient presents with  . Otalgia  . Sore Throat   (Consider location/radiation/quality/duration/timing/severity/associated sxs/prior Treatment) HPI  URI: present for 6 wks. Given augmentin at [facility]. Did not improve symptoms. Associated w/ watery eyes, sinus congestion, HA, and full ear sensation. Denies fever, vomiting, diarrhea. Denies allergies. No change in symptoms. claritin w/ some benefit. Worse at night.   History reviewed. No pertinent past medical history. History reviewed. No pertinent past surgical history. History reviewed. No pertinent family history. History  Substance Use Topics  . Smoking status: Never Smoker   . Smokeless tobacco: Never Used  . Alcohol Use: No   OB History   Grav Para Term Preterm Abortions TAB SAB Ect Mult Living   3 3 3             Review of Systems  Constitutional: Negative for fever and activity change.  HENT: Positive for congestion, postnasal drip and rhinorrhea. Negative for ear discharge and ear pain.   Eyes: Positive for discharge.  All other systems reviewed and are negative.    Allergies  Review of patient's allergies indicates no known allergies.  Home Medications   Current Outpatient Rx  Name  Route  Sig  Dispense  Refill  . loratadine (CLARITIN) 10 MG tablet   Oral   Take 1 tablet (10 mg total) by mouth daily.   30 tablet   11   . pantoprazole (PROTONIX) 40 MG tablet   Oral   Take 1 tablet (40 mg total) by mouth daily.   30 tablet   3   . polyethylene glycol powder (GLYCOLAX/MIRALAX) powder   Oral   Take 17 g by mouth 2 (two) times daily as needed.   3350 g   1   . pseudoephedrine (SUDAFED) 30 MG tablet   Oral   Take 30 mg by mouth every 4 (four) hours as needed for congestion.         Marland Kitchen. amoxicillin-clavulanate (AUGMENTIN) 875-125 MG per tablet   Oral   Take 1  tablet by mouth 2 (two) times daily.   20 tablet   0   . fluticasone (FLONASE) 50 MCG/ACT nasal spray   Each Nare   Place 1 spray into both nostrils at bedtime.   16 g   2   . guaiFENesin (MUCINEX) 600 MG 12 hr tablet   Oral   Take 1,200 mg by mouth 2 (two) times daily as needed for cough.         Marland Kitchen. ipratropium (ATROVENT) 0.06 % nasal spray   Each Nare   Place 2 sprays into both nostrils 4 (four) times daily.   15 mL   12   . Olopatadine HCl 0.2 % SOLN   Ophthalmic   Apply 1 drop to eye daily.   2.5 mL   0    BP 120/72  Pulse 74  Temp(Src) 98.7 F (37.1 C) (Oral)  Resp 12  SpO2 100%  LMP 12/19/2013 Physical Exam  Constitutional: She is oriented to person, place, and time. She appears well-developed and well-nourished. No distress.  HENT:  Head: Normocephalic and atraumatic.  Eyes:  R slceral injection. No purulence, EOMI, nontender  Neck: Normal range of motion. Neck supple. No thyromegaly present.  Cardiovascular: Normal rate, normal heart sounds and intact distal pulses.  Exam reveals no gallop.  No murmur heard. Pulmonary/Chest: Effort normal and breath sounds normal. No respiratory distress.  Abdominal: Soft. Bowel sounds are normal.  Musculoskeletal: Normal range of motion. She exhibits no tenderness.  Neurological: She is alert and oriented to person, place, and time.  Skin: Skin is warm. No rash noted.  Psychiatric: She has a normal mood and affect. Her behavior is normal. Judgment and thought content normal.    ED Course  Procedures (including critical care time) Labs Review Labs Reviewed - No data to display Imaging Review No results found.   MDM   1. Viral URI with cough   2. Multiple allergies   3. Headache    37yo hispanic female w/ persistend rinorrhea, watery eyes, and cough.  - pataday, flonase, nasal atrovent - OTC mucinexD and motrin - precautions given - no need for ABX   Shelly Flatten, MD Family Medicine PGY-3 12/27/2013,  5:44 PM      Ozella Rocks, MD 12/27/13 [phone]

## 2013-12-27 NOTE — Discharge Instructions (Signed)
You are likely suffering from a mix of allergies and a viral upper respiratory tract infection Please start the Pataday for the eyes Please start flonase at night and atrovent during the day Please take motrin 600mg  every 6 hours for the next few days  Es probable que sufra de [place] mezcla de las alergias y [place]una infeccin del tracto respiratorio superior Por favor inicie la Pataday para los ojos Por favor iniciar flonase por la noche y Administ[employer]urante el da atrovent Por favor, tome Motrin 600 mg cada 6 horas para los Nucor Corporationprximos das

## 2013-12-27 NOTE — ED Notes (Signed)
C/o earache x 3 weeks, with congestion and sore throat. [facility] and [facility].  States it stopped the phlegm, but not the sore throat, runny nose or earache.  Taking Motrin.

## 2013-12-28 NOTE — ED Provider Notes (Signed)
Medical screening examination/treatment/procedure(s) were performed by a resident physician or non-physician practitioner and as the supervising physician I was immediately available for consultation/collaboration.  Clementeen GrahamEvan Thompson Mckim, MD   Rodolph BongEvan S Jaydis Duchene, MD 12/28/13 [phone]

## 2013-12-30 ENCOUNTER — Ambulatory Visit: Payer: No Typology Code available for payment source | Admitting: Internal Medicine

## 2014-01-19 ENCOUNTER — Ambulatory Visit: Payer: No Typology Code available for payment source | Attending: Internal Medicine | Admitting: Internal Medicine

## 2014-01-19 ENCOUNTER — Encounter: Payer: Self-pay | Admitting: Internal Medicine

## 2014-01-19 VITALS — BP 114/71 | HR 83 | Temp 97.7°F | Resp 16 | Ht 63.0 in | Wt 153.0 lb

## 2014-01-19 DIAGNOSIS — K029 Dental caries, unspecified: Secondary | ICD-10-CM | POA: Insufficient documentation

## 2014-01-19 DIAGNOSIS — J309 Allergic rhinitis, unspecified: Secondary | ICD-10-CM | POA: Insufficient documentation

## 2014-01-19 MED ORDER — LORATADINE 10 MG PO TABS: 10.0000 mg | ORAL_TABLET | Freq: Every day | ORAL | Status: DC

## 2014-01-19 NOTE — Progress Notes (Signed)
Patient ID: Heidi Ellis, female   DOB: Mar 03, 1976, 38 y.o.   MRN: [id number]   Heidi Ellis, is a 38 y.o. female  [id number]  [id number]  DOB - Mar 03, 1976  Chief Complaint  Patient presents with  . Follow-up  . Allergic Rhinitis         Subjective:   Heidi Ellis is a 38 y.o. female here today for a follow up visit. Pt here with c/o allergy symptoms with 3 weeks runny nose, and sore throat, Pt is taking Claritin and Flonase with some improvement. No fever, no chills, no nausea or vomiting. Patient also wants a referral to dentist for some dental caries and cavities. Patient has No headache, No chest pain, No abdominal pain - No Nausea, No new weakness tingling or numbness, No Cough - SOB.  Problem  Allergic Rhinitis  Dental Caries    ALLERGIES: No Known Allergies  PAST MEDICAL HISTORY: History reviewed. No pertinent past medical history.  MEDICATIONS AT HOME: Prior to Admission medications   Medication Sig Start Date End Date Taking? Authorizing Provider  fluticasone (FLONASE) 50 MCG/ACT nasal spray Place 1 spray into both nostrils at bedtime. 12/27/13  Yes Ozella Rocksavid J Merrell, MD  loratadine (CLARITIN) 10 MG tablet Take 1 tablet (10 mg total) by mouth daily. 01/19/14  Yes Jeanann Lewandowskylugbemiga Froylan Hobby, MD  pantoprazole (PROTONIX) 40 MG tablet Take 1 tablet (40 mg total) by mouth daily. 11/21/13  Yes Richarda OverlieNayana Abrol, MD  amoxicillin-clavulanate (AUGMENTIN) 875-125 MG per tablet Take 1 tablet by mouth 2 (two) times daily. 11/21/13   Richarda OverlieNayana Abrol, MD  guaiFENesin (MUCINEX) 600 MG 12 hr tablet Take 1,200 mg by mouth 2 (two) times daily as needed for cough.    Historical Provider, MD  ipratropium (ATROVENT) 0.06 % nasal spray Place 2 sprays into both nostrils 4 (four) times daily. 12/27/13   Ozella Rocksavid J Merrell, MD  Olopatadine HCl 0.2 % SOLN Apply 1 drop to eye daily. 12/27/13   Ozella Rocksavid J Merrell, MD  polyethylene glycol powder (GLYCOLAX/MIRALAX) powder Take 17 g by mouth 2 (two)  times daily as needed. 11/21/13   Richarda OverlieNayana Abrol, MD  pseudoephedrine (SUDAFED) 30 MG tablet Take 30 mg by mouth every 4 (four) hours as needed for congestion.    Historical Provider, MD     Objective:   Filed Vitals:   01/19/14 1427  BP: 114/71  Pulse: 83  Temp: 97.7 F (36.5 C)  TempSrc: Oral  Resp: 16  Height: 5\' 3"  (1.6 m)  Weight: 153 lb (69.4 kg)  SpO2: 97%    Exam General appearance : Awake, alert, not in any distress. Speech Clear. Not toxic looking HEENT: Nasal congestion, Atraumatic and Normocephalic, pupils equally reactive to light and accomodation Neck: supple, no JVD. No cervical lymphadenopathy.  Chest:Good air entry bilaterally, no added sounds  CVS: S1 S2 regular, no murmurs.  Abdomen: Bowel sounds present, Non tender and not distended with no gaurding, rigidity or rebound. Extremities: B/L Lower Ext shows no edema, both legs are warm to touch Neurology: Awake alert, and oriented X 3, CN II-XII intact, Non focal Skin:No Rash Wounds:N/A  Data Review Lab Results  Component Value Date   HGBA1C 5.6 % 03/18/2013     Assessment & Plan   1. Allergic rhinitis  - loratadine (CLARITIN) 10 MG tablet; Take 1 tablet (10 mg total) by mouth daily.  Dispense: 30 tablet; Refill: 3  2. Dental caries  - Ambulatory referral to Dentistry  Patient was counseled extensively on nutrition and exercise  Return in about 6 months (around 07/21/2014), or if symptoms worsen or fail to improve, for Annual Physical.  The patient was given clear instructions to go to ER or return to medical center if symptoms don't improve, worsen or new problems develop. The patient verbalized understanding. The patient was told to call to get lab results if they haven't heard anything in the next week.   This note has been created with Education officer, environmental. Any transcriptional errors are unintentional.    Jeanann Lewandowsky, MD, MHA, FACP, [facility] and [facility] [place], [place] [phone]   01/19/2014, 2:49 PM

## 2014-01-19 NOTE — Progress Notes (Signed)
Pt here with c/o allergy sx 3 weeks runny nose,sore throat and left eye twitching  Pt is taking Claritin and Flonase which has improved Denies fever,chills,n,v Language barrier noted

## 2014-08-14 ENCOUNTER — Encounter: Payer: Self-pay | Admitting: Internal Medicine

## 2014-11-26 ENCOUNTER — Emergency Department (HOSPITAL_COMMUNITY): Payer: Self-pay

## 2014-11-26 ENCOUNTER — Encounter (HOSPITAL_COMMUNITY): Payer: Self-pay | Admitting: Emergency Medicine

## 2014-11-26 ENCOUNTER — Emergency Department (HOSPITAL_COMMUNITY)
Admission: EM | Admit: 2014-11-26 | Discharge: 2014-11-26 | Disposition: A | Discharge status: Home / Self Care | Payer: Self-pay | Attending: Emergency Medicine | Admitting: Emergency Medicine

## 2014-11-26 DIAGNOSIS — Y92410 Unspecified street and highway as the place of occurrence of the external cause: Secondary | ICD-10-CM | POA: Insufficient documentation

## 2014-11-26 DIAGNOSIS — Y998 Other external cause status: Secondary | ICD-10-CM | POA: Insufficient documentation

## 2014-11-26 DIAGNOSIS — S161XXA Strain of muscle, fascia and tendon at neck level, initial encounter: Secondary | ICD-10-CM | POA: Insufficient documentation

## 2014-11-26 DIAGNOSIS — Y9389 Activity, other specified: Secondary | ICD-10-CM | POA: Insufficient documentation

## 2014-11-26 LAB — CBC
HCT: 33.1 % — ABNORMAL LOW (ref 36.0–46.0)
Hemoglobin: 10.6 g/dL — ABNORMAL LOW (ref 12.0–15.0)
MCH: 25.5 pg — ABNORMAL LOW (ref 26.0–34.0)
MCHC: 32 g/dL (ref 30.0–36.0)
MCV: 79.8 fL (ref 78.0–100.0)
PLATELETS: 279 10*3/uL (ref 150–400)
RBC: 4.15 MIL/uL (ref 3.87–5.11)
RDW: 14.1 % (ref 11.5–15.5)
WBC: 6.6 10*3/uL (ref 4.0–10.5)

## 2014-11-26 LAB — BASIC METABOLIC PANEL
Anion gap: 4 — ABNORMAL LOW (ref 5–15)
BUN: 15 mg/dL (ref 6–23)
CO2: 24 mmol/L (ref 19–32)
CREATININE: 0.68 mg/dL (ref 0.50–1.10)
Calcium: 9.1 mg/dL (ref 8.4–10.5)
Chloride: 107 mmol/L (ref 96–112)
GLUCOSE: 107 mg/dL — AB (ref 70–99)
POTASSIUM: 3.6 mmol/L (ref 3.5–5.1)
SODIUM: 135 mmol/L (ref 135–145)

## 2014-11-26 LAB — I-STAT TROPONIN, ED: TROPONIN I, POC: 0 ng/mL (ref 0.00–0.08)

## 2014-11-26 MED ORDER — HYDROCODONE-ACETAMINOPHEN 5-325 MG PO TABS: 1.0000 | ORAL_TABLET | Freq: Four times a day (QID) | ORAL | Status: DC | PRN

## 2014-11-26 MED ORDER — IBUPROFEN 800 MG PO TABS: 800.0000 mg | ORAL_TABLET | Freq: Three times a day (TID) | ORAL | Status: DC | PRN
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
## 2014-11-26 NOTE — ED Notes (Signed)
Pt returned from XRAY 
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
## 2014-11-26 NOTE — Discharge Instructions (Signed)
Return here as needed.  Follow-up with a primary care doctor.  Your x-rays did not show any abnormalities.  You can expect to be sore over the next 7-10 days.  Ice and heat on the areas that are sore

## 2015-06-12 ENCOUNTER — Ambulatory Visit: Payer: Self-pay | Attending: Internal Medicine

## 2015-09-20 ENCOUNTER — Ambulatory Visit: Payer: Self-pay | Attending: Internal Medicine

## 2015-11-08 ENCOUNTER — Encounter: Payer: Self-pay | Admitting: Internal Medicine

## 2015-11-08 ENCOUNTER — Ambulatory Visit: Payer: Self-pay | Attending: Internal Medicine | Admitting: Internal Medicine

## 2015-11-08 VITALS — BP 107/71 | HR 71 | Temp 97.6°F | Resp 18 | Ht 64.0 in | Wt 148.0 lb

## 2015-11-08 DIAGNOSIS — Z79899 Other long term (current) drug therapy: Secondary | ICD-10-CM | POA: Insufficient documentation

## 2015-11-08 DIAGNOSIS — Z124 Encounter for screening for malignant neoplasm of cervix: Secondary | ICD-10-CM

## 2015-11-08 DIAGNOSIS — Z01419 Encounter for gynecological examination (general) (routine) without abnormal findings: Secondary | ICD-10-CM | POA: Insufficient documentation

## 2015-11-08 DIAGNOSIS — Z0000 Encounter for general adult medical examination without abnormal findings: Secondary | ICD-10-CM | POA: Insufficient documentation

## 2015-11-08 LAB — LIPID PANEL
CHOL/HDL RATIO: 4.2 ratio (ref <=5.0)
Cholesterol: 201 mg/dL — ABNORMAL HIGH (ref 125–200)
HDL: 48 mg/dL (ref >=46)
LDL Cholesterol: 125 mg/dL (ref <130)
Triglycerides: 139 mg/dL (ref <150)
VLDL: 28 mg/dL (ref <30)

## 2015-11-08 LAB — COMPLETE METABOLIC PANEL WITH GFR
ALT: 15 U/L (ref 6–29)
AST: 18 U/L (ref 10–30)
Albumin: 4.3 g/dL (ref 3.6–5.1)
Alkaline Phosphatase: 48 U/L (ref 33–115)
BUN: 10 mg/dL (ref 7–25)
CO2: 26 mmol/L (ref 20–31)
Calcium: 9.3 mg/dL (ref 8.6–10.2)
Chloride: 102 mmol/L (ref 98–110)
Creat: 0.76 mg/dL (ref 0.50–1.10)
GFR, Est African American: >89 mL/min (ref >=60)
GFR, Est Non African American: >89 mL/min (ref >=60)
Glucose, Bld: 85 mg/dL (ref 65–99)
Potassium: 4.7 mmol/L (ref 3.5–5.3)
Sodium: 137 mmol/L (ref 135–146)
Total Bilirubin: 0.8 mg/dL (ref 0.2–1.2)
Total Protein: 7.1 g/dL (ref 6.1–8.1)

## 2015-11-08 LAB — CBC WITH DIFFERENTIAL/PLATELET
Basophils Absolute: 0.1 10*3/uL (ref 0.0–0.1)
Basophils Relative: 1 % (ref 0–1)
EOS ABS: 0.1 10*3/uL (ref 0.0–0.7)
EOS PCT: 1 % (ref 0–5)
HCT: 34.6 % — ABNORMAL LOW (ref 36.0–46.0)
Hemoglobin: 10.6 g/dL — ABNORMAL LOW (ref 12.0–15.0)
LYMPHS ABS: 1.5 10*3/uL (ref 0.7–4.0)
Lymphocytes Relative: 29 % (ref 12–46)
MCH: 23.6 pg — AB (ref 26.0–34.0)
MCHC: 30.6 g/dL (ref 30.0–36.0)
MCV: 77.1 fL — AB (ref 78.0–100.0)
MONOS PCT: 8 % (ref 3–12)
MPV: 10.6 fL (ref 8.6–12.4)
Monocytes Absolute: 0.4 10*3/uL (ref 0.1–1.0)
Neutro Abs: 3.1 10*3/uL (ref 1.7–7.7)
Neutrophils Relative %: 61 % (ref 43–77)
PLATELETS: 300 10*3/uL (ref 150–400)
RBC: 4.49 MIL/uL (ref 3.87–5.11)
RDW: 16.5 % — AB (ref 11.5–15.5)
WBC: 5.1 10*3/uL (ref 4.0–10.5)

## 2015-11-08 LAB — POCT GLYCOSYLATED HEMOGLOBIN (HGB A1C): HEMOGLOBIN A1C: 5.5

## 2015-11-08 NOTE — Progress Notes (Signed)
Patient ID: Heidi Ellis, female   DOB: 12-20-1975, 40 y.o.   MRN: [id number]   Heidi Ellis, is a 40 y.o. female  [id number]  [id number]  DOB - 1976/09/08  Chief Complaint  Patient presents with  . Annual Exam        Subjective:   Heidi Ellis is a 40 y.o. female with no significant past medical history here today for annual physical examination and Pap smear. She has no complaints. She lives at home with her 3 children, divorced, last sexual activity was 2013. Currently unemployed but all financial responsibilities is by her ex husband. She denies depression. No urinary symptoms. No vaginal discharge. Self breast examination monthly, no concerns. Patient has No headache, No chest pain, No abdominal pain - No Nausea, No new weakness tingling or numbness, No Cough - SOB.  Problem  Encounter for Annual Physical Exam  Pap Smear for Cervical Cancer Screening    ALLERGIES: No Known Allergies  PAST MEDICAL HISTORY: History reviewed. No pertinent past medical history.  MEDICATIONS AT HOME: Prior to Admission medications   Medication Sig Start Date End Date Taking? Authorizing Provider  fluticasone (FLONASE) 50 MCG/ACT nasal spray Place 1 spray into both nostrils at bedtime. 12/27/13  Yes Ozella Rocks, MD  ibuprofen (ADVIL,MOTRIN) 800 MG tablet Take 1 tablet (800 mg total) by mouth every 8 (eight) hours as needed. 11/26/14  Yes Christopher Lawyer, PA-C  ipratropium (ATROVENT) 0.06 % nasal spray Place 2 sprays into both nostrils 4 (four) times daily. Patient not taking: Reported on 11/08/2015 12/27/13   Ozella Rocks, MD  loratadine (CLARITIN) 10 MG tablet Take 1 tablet (10 mg total) by mouth daily. Patient not taking: Reported on 11/08/2015 01/19/14   Quentin Angst, MD  Olopatadine HCl 0.2 % SOLN Apply 1 drop to eye daily. Patient not taking: Reported on 11/08/2015 12/27/13   Ozella Rocks, MD  pantoprazole (PROTONIX) 40 MG tablet Take 1 tablet (40 mg  total) by mouth daily. Patient not taking: Reported on 11/08/2015 11/21/13   Richarda Overlie, MD     Objective:   Filed Vitals:   11/08/15 0902  BP: 107/71  Pulse: 71  Temp: 97.6 F (36.4 C)  TempSrc: Oral  Resp: 18  Height:  (1.626 m)  Weight: 148 lb (67.132 kg)  SpO2: 100%    Exam General appearance : Awake, alert, not in any distress. Speech Clear. Not toxic looking HEENT: Atraumatic and Normocephalic, pupils equally reactive to light and accomodation Neck: supple, no JVD. No cervical lymphadenopathy.  Chest:Good air entry bilaterally, no added sounds  CVS: S1 S2 regular, no murmurs.  Abdomen: Bowel sounds present, Non tender and not distended with no gaurding, rigidity or rebound. Extremities: B/L Lower Ext shows no edema, both legs are warm to touch Neurology: Awake alert, and oriented X 3, CN II-XII intact, Non focal  Pelvic Exam: Cervix normal in appearance, external genitalia normal, no adnexal masses or tenderness, no cervical motion tenderness, rectovaginal septum normal, uterus normal size, shape, and consistency and vagina normal without discharge   Data Review Lab Results  Component Value Date   HGBA1C 5.6 % 03/18/2013     Assessment & Plan   1. Encounter for annual physical exam  - CBC with Differential/Platelet - COMPLETE METABOLIC PANEL WITH GFR - POCT glycosylated hemoglobin (Hb A1C) - Lipid panel - Urinalysis, Complete - HIV antibody (with reflex)  2. Pap smear for cervical cancer screening  - Cytology - PAP [place] - Cervicovaginal  ancillary only  Patient have been counseled extensively about nutrition and exercise  Return in about 1 year (around 11/07/2016) for Annual Physical.  The patient was given clear instructions to go to ER or return to medical center if symptoms don't improve, worsen or new problems develop. The patient verbalized understanding. The patient was told to call to get lab results if they haven't heard anything in  the next week.   This note has been created with Education officer, environmental. Any transcriptional errors are unintentional.    Jeanann Lewandowsky, MD, MHA, Maxwell Caul, CPE [facility] and [facility] [address] [place], [place] [phone]   11/08/2015, 9:49 AM

## 2015-11-08 NOTE — Progress Notes (Signed)
Patient here for physical with pelvic exam. Patient denies pain and has no complaints at this time. Last period 10/26/15.

## 2015-11-08 NOTE — Patient Instructions (Signed)
Plan de alimentacin cardiosaludable (Heart-Healthy Eating Plan) Muchos factores influyen en la salud cardaca, entre ellos, los hbitos de alimentacin y de ejercicio fsico. El riesgo cardaco (coronario) aumenta con los niveles anormales de grasa (lpidos) en la [place]. La planificacin de las comidas cardiosaludables incluye limitar las grasas poco saludables, aumentar las grasas saludables y Radio producer otros cambios pequeos en la dieta, por ejemplo, mantener un peso corporal saludable para ayudar a que los niveles de lpidos se mantengan dentro de un rango normal. EN QU CONSISTE EL PLAN?  El mdico le recomienda que:  No consuma ms del __________% de las caloras totales en su ingesta diaria de grasa.  Limite su ingesta de grasas saturadas a menos del ______% de las caloras totales diarias.  Limite la cantidad de colesterol en su dieta a menos de _________mg Karie Chimera. QU TIPOS DE GRASAS DEBO ELEGIR?  Elija grasas saludables con mayor frecuencia. Elija las grasas monoinsaturadas y [address], como el aceite de oliva y canola, las semillas de [place], las nueces, las almendras y las semillas.  Consuma ms grasas omega-3. Las mejores opciones incluyen salmn, caballa, sardinas, atn, aceite de lino y semillas de lino molidas. Propngase comer pescado al [employer] veces por semana.  Limite el consumo de grasas saturadas. Estas se encuentran principalmente en los productos de origen animal, como las carnes, la [place] y la crema. Las grasas saturadas de origen vegetal incluyen aceite de palma, de palmiste y de coco.  Evite los alimentos con aceites parcialmente hidrogenados. Estos contienen grasas trans. Entre los ejemplos de alimentos con grasas trans se incluyen margarinas en barra, algunas margarinas untables, galletas dulces o saladas y otros productos horneados. QU PAUTAS GENERALES DEBO SEGUIR?  Lea las etiquetas de los alimentos detenidamente para identificar los que contienen  grasas trans o altas cantidades de grasas saturadas.  Llene la mitad del plato con verduras y ensaladas de hojas verdes. Coma 4 o 5porciones de verduras Air cabin crew. Una porcin de verduras equivale a 1taza de verduras de hoja crudas, taza de verduras en trozos crudas o cocidas, o taza de jugo de verduras.  Llene un cuarto del plato con cereales integrales. Busque la palabra "integral" en Estate agent de la lista de ingredientes.  Llene un cuarto del plato con alimentos con protenas magras.  Coma 4 o 5porciones de frutas por da. Una porcin de frutas equivale a una fruta mediana entera; taza de fruta disecada; taza de frutas frescas, congeladas o enlatadas, o taza de jugo 100% de fruta.  Consuma ms alimentos con fibra soluble, por ejemplo, manzanas, brcoli, zanahorias, frijoles, guisantes y [place]. Trate de consumir de 20a 30g de [employer].  Consuma ms comida casera y menos de restaurante, de buf y comida rpida.  Limite o evite el alcohol.  Limite los alimentos con alto contenido de almidn y International aid/development worker.  Evite las comidas fritas.  Cocine los alimentos utilizando mtodos que no sean la fritura. Las opciones de coccin ms [place] son Development worker, community, Regulatory affairs officer, Software engineer y asar a Patent attorney. Otras sugerencias para reducir las grasas incluyen lo siguiente:  Quite la piel de las aves.  Quite todas las grasas visibles de las carnes.  Espume la grasa de los guisos, las sopas y las salsas antes de servirlos.  Cocine al vapor las verduras en agua o caldo.  Baje de peso si es necesario. Perder solo del 5 al 10% de su peso inicial puede ayudarle a mejorar su estado de salud general y a Education officer, museum, Immunologist  diabetes y las enfermedades cardacas.  Aumente el consumo de frutos secos, legumbres y semillas a 4 o 5porciones por semana. Una porcin de frijoles o legumbres secos equivale a taza despus de su coccin, una porcin de frutos secos equivale a 1onzas y [place]  porcin de semillas equivale a onza o 1cucharada.  Es posible que deba controlar la ingesta de sal (sodio), especialmente si tiene hipertensin arterial. Hable con el mdico o el nutricionista para obtener ms informacin sobre cmo Recruitment consultant. QU ALIMENTOS PUEDO COMER? Cereales Panes, incluido el pan francs, blanco, pita, de [place], de pasas de [place], de centeno, de avena e [place]. Tortillas que no estn fritas ni elaboradas con manteca de cerdo ni grasas trans. Panecillos bajos en grasas, incluidos los panes para perros calientes y [place], y los bollitos tipo ingls. Galletas. Muffins. Waffles. Panqueques. Palomitas de maz con bajo contenido calrico. Cereales integrales. Pan sin levadura. Tostada Melba. Pretzels. Palitos de pan. Galletas. Galletitas y Product manager en grasas, entre ellas, las que tienen forma de [place], las [place], el pan cimo, las [place], las que tienen forma de [place] y las de centeno. Arroz y pastas, incluido el arroz integral y las pastas elaboradas con cereales integrales. Verduras Todas las verduras. Frutas Todas las frutas, pero limite el [place]. [place] y [place] fuentes de protenas Carne de res, [place], cerdo y cordero magras sin grasa. Pollo y pavo sin piel. Todos los pescados y [employer]. Pato salvaje, conejo, faisn y venado. Claras de huevo o sustitutos del huevo bajos en colesterol. Porotos, guisantes, lentejas secos y tofu.Semillas y la mayora de los frutos secos. Lcteos Quesos descremados y semidescremados, entre ellos, ricota, queso en hebras y Garment/textile technologist. Leche descremada o al 1% que sea lquida, en polvo o evaporada. Suero de [employer] elaborado con [employer]. Yogur descremado o bajo en grasas. Bebidas Agua mineral. Bebidas gaseosas dietticas. Dulces y postres Sorbetes y helados de fruta. [place], [place], [place], jalea y [place]. Merengues y gelatinas. Caramelos de [employer] International, como caramelos duros, caramelos de goma,  pastillas de goma, mentas, malvaviscos y pequeas cantidades de chocolate amargo. Torta ngel. Coma todos los dulces y postres con moderacin. Grasas y Writer no hidrogenadas (sin grasas trans). Aceites vegetales, incluido el de soja, ssamo, girasol, [place], man, crtamo, maz, canola y semillas de algodn. Alios para ensalada o mayonesa elaborados con aceite vegetal. Limite las grasas y los aceites agregados que [place] para Water quality scientist, Development worker, community, preparar ensaladas y las cremas untables. Otros [place] en polvo. T o caf. Todos los alios y condimentos. Los artculos mencionados arriba pueden no ser Raytheon de las bebidas o los alimentos recomendados. Comunquese con el nutricionista para conocer ms opciones. QU ALIMENTOS NO SE RECOMIENDAN? Cereales Panes elaborados con grasas saturadas o trans, aceites o Eastman Kodak. Croissants. Panecillos de mantequilla. Panes de queso. Panecillos dulces. Rosquillas. Palomitas de maz con mantequilla. Fideos chow mein. Galletitas con [employer]Ex de grasas, como las que contienen queso o [place]. Carnes y [address] fuentes de protenas Carnes grasas, como perros calientes, [place] de res, [address], puntas de [place], [place], asado de [place] o [place], y carnero. Fiambres altos en grasas, como salame y [place]. Caviar. Pato y ganso domsticos. Vsceras, como riones, hgado, [place], sesos, [place] de ave, chinchulines y Programmer, multimedia. Lcteos Crema, crema agria, queso crema y [place] cottage con crema. Quesos elaborados con Eastman Kodak, incluido el queso [place] (bleu), [place], [place], [place], [place], [place], suizo, cheddar, camembert y [place]. Leche entera o al 2% que sea lquida, evaporada  o condensada. Suero de [employer]. Salsa de crema o queso alta en grasas. Yogur elaborado con Eastman Kodak. Bebidas Refrescos regulares y bebidas con agregado de azcar. Dulces y [employer]. Pudin. Galletas. Tortas que no sean la  torta ngel. Caramelos que contengan chocolate con leche o chocolate blanco, grasa hidrogenada, mantequilla, coco o ingredientes desconocidos. Almbares con mantequilla. Helados o bebidas elaboradas con helado con alto contenido de grasas. Grasas y [address] que [place], [place] (the [place] of 60 deg S) de carne o materia grasa. Manteca de cacao, aceites hidrogenados, aceite de palma, aceite de coco, aceite de palmiste. A menudo, estos se encuentran en los productos horneados, los caramelos, las comidas fritas, las cremas no lcteas y las coberturas batidas. Grasas y [address] grasas slidas, incluida la grasa del tocino, el cerdo [place], la [place] de cerdo y Civil engineer, contracting. Sustitutos de crema no lctea, como cremas para caf y sustitutos de crema agria. Alios para ensaladas elaborados con aceites desconocidos, queso o crema agria. Los artculos mencionados arriba pueden no ser Raytheon de las bebidas y los alimentos que se Theatre stage manager. Comunquese con el nutricionista para obtener ms informacin.   Esta informacin no tiene Theme park manager el consejo del mdico. Asegrese de hacerle al mdico cualquier pregunta que tenga.   Document Released: 07/27/2009 Document Revised: 10/20/2014 Elsevier Interactive Patient Education [employer]

## 2015-11-09 LAB — CYTOLOGY - PAP

## 2015-11-09 LAB — CERVICOVAGINAL ANCILLARY ONLY
CHLAMYDIA, DNA PROBE: NEGATIVE
NEISSERIA GONORRHEA: NEGATIVE
TRICH (WINDOWPATH): NEGATIVE
WET PREP (BD AFFIRM): NEGATIVE

## 2015-11-09 LAB — URINALYSIS, COMPLETE
Bacteria, UA: NONE SEEN [HPF]
Bilirubin Urine: NEGATIVE
CASTS: NONE SEEN [LPF]
Crystals: NONE SEEN [HPF]
Glucose, UA: NEGATIVE
Hgb urine dipstick: NEGATIVE
KETONES UR: NEGATIVE
Leukocytes, UA: NEGATIVE
NITRITE: NEGATIVE
PH: 6.5 (ref 5.0–8.0)
PROTEIN: NEGATIVE
RBC / HPF: NONE SEEN RBC/HPF (ref <=2)
SPECIFIC GRAVITY, URINE: 1.017 (ref 1.001–1.035)
Squamous Epithelial / LPF: NONE SEEN [HPF] (ref <=5)
WBC UA: NONE SEEN WBC/HPF (ref <=5)
Yeast: NONE SEEN [HPF]

## 2015-11-09 LAB — HIV ANTIBODY (ROUTINE TESTING W REFLEX): HIV: NONREACTIVE

## 2015-11-20 ENCOUNTER — Telehealth: Payer: Self-pay | Admitting: *Deleted

## 2015-11-20 NOTE — Telephone Encounter (Signed)
Patient verified DOB Patient informed of lab results being normal including Pap smear, infection and STD's, as well as HIV. Patients aware of hemoglobin being slightly low and patient was advised to pick-up an OTC iron supplement. Patient expressed her understanding and had no further questions.

## 2015-11-20 NOTE — Telephone Encounter (Signed)
-----   Message from Quentin Angst, MD sent at 11/16/2015 11:50 AM EST ----- Please inform patient of her lab results are normal. Pap smear cytology is negative for malignancy. There is no evidence of infection or sexually transmitted diseases. HIV is negative as of 11/08/2015. Hemoglobin is slightly low but stable. Advise patient to take over-the-counter iron supplements.

## 2016-02-29 ENCOUNTER — Encounter (HOSPITAL_COMMUNITY): Payer: Self-pay

## 2016-02-29 ENCOUNTER — Ambulatory Visit (HOSPITAL_COMMUNITY)
Admission: EM | Admit: 2016-02-29 | Discharge: 2016-02-29 | Disposition: A | Discharge status: Home / Self Care | Payer: No Typology Code available for payment source | Attending: Family Medicine | Admitting: Family Medicine

## 2016-02-29 DIAGNOSIS — J302 Other seasonal allergic rhinitis: Secondary | ICD-10-CM

## 2016-02-29 DIAGNOSIS — J069 Acute upper respiratory infection, unspecified: Secondary | ICD-10-CM

## 2016-02-29 MED ORDER — FLUTICASONE PROPIONATE 50 MCG/ACT NA SUSP: 2.0000 | Freq: Every day | NASAL | Status: DC

## 2016-02-29 MED ORDER — CETIRIZINE HCL 10 MG PO TABS: 10.0000 mg | ORAL_TABLET | Freq: Every day | ORAL | Status: DC

## 2016-02-29 MED ORDER — GUAIFENESIN ER 600 MG PO TB12: 1200.0000 mg | ORAL_TABLET | Freq: Two times a day (BID) | ORAL | Status: DC

## 2016-02-29 NOTE — ED Notes (Signed)
Patient states she has had a sore throat, headache, and earache x1 week and she thinks she had a fever on last night 02/28/2016. Patient complains of headache being the worst pain currently. Patient feels a little nausea, no vomiting or diarrhea. No acute distress

## 2016-02-29 NOTE — Discharge Instructions (Signed)
Fue un Research officer, trade unionplacer verle hoy.  Creo que los sintomas que tiene se deben a una infeccion viral respiratoria.   Le estoy recomendando tres cosas para aliviarle los sintomas:   GUAIFENESIN 600mg  tabletas, tome 2 tabletas (1200mg ) por boca, 2 veces por dia, para hacer que se fluya la secrecion nasal y descongestionarle la cabeza y los oidos.   FLONASE espray nasal, 2 soplidos en cada fosa nasal, una vez cada manana. Hacer gargaras de agua despues de aplicar este medicamento.   CETIRIZINE 10mg , tome una tableta por boca, cada manana. Antihistaminico para ayudar con las Deere & Companyalergias.   Seguimiento con su medico de cabecera en [facility] si no esta' mejorando.

## 2016-02-29 NOTE — ED Provider Notes (Signed)
CSN: [id number]     Arrival date & time 02/29/16  1616 History   First MD Initiated Contact with Patient 02/29/16 1808     Chief Complaint  Patient presents with  . Headache  . Sore Throat   (Consider location/radiation/quality/duration/timing/severity/associated sxs/prior Treatment) Patient is a 40 y.o. female presenting with headaches and pharyngitis. The history is provided by the patient. No language interpreter was used.  Headache Associated symptoms: cough, ear pain, fatigue, sinus pressure and sore throat   Associated symptoms: no abdominal pain, no diarrhea, no fever and no vomiting   Sore Throat Associated symptoms include headaches. Pertinent negatives include no chest pain, no abdominal pain and no shortness of breath.  Visit conducted in Spanish. Patient reports upper respiratory sxs since this past Monday (May 15) when she awoke with sore throat. A day later with bilateral ear pain, and then a nighttime cough that has scant sputum (mostly dry).  Frontal head congestion and pressure-like headache.  No photophobia, mild nausea, no vomiting. No measured fevers but did feel chills in the past 1-2 nights.  Her 2 sons are with her today, neither have been ill. No sick contacts.  She is a nonsmoker.   Taking Advil Cold and Sinus as well as an aspirin-based product from [place] ("Agin"), mild relief.   NKDA.   LMP February 04, 2016.   History reviewed. No pertinent past medical history. Past Surgical History  Procedure Laterality Date  . Cesarean section  2008   Family History  Problem Relation Age of Onset  . Asthma Mother    Social History  Substance Use Topics  . Smoking status: Never Smoker   . Smokeless tobacco: Never Used  . Alcohol Use: No   OB History    Gravida Para Term Preterm AB TAB SAB Ectopic Multiple Living   Review of Systems  Constitutional: Positive for chills and fatigue. Negative for fever.  HENT: Positive for ear pain, rhinorrhea,  sinus pressure and sore throat. Negative for nosebleeds.   Respiratory: Positive for cough. Negative for shortness of breath.   Cardiovascular: Negative for chest pain and palpitations.  Gastrointestinal: Negative for vomiting, abdominal pain and diarrhea.  Neurological: Positive for headaches.  All other systems reviewed and are negative.   Allergies  Review of patient's allergies indicates no known allergies.  Home Medications   Prior to Admission medications   Medication Sig Start Date End Date Taking? Authorizing Provider  ipratropium (ATROVENT) 0.06 % nasal spray Place 2 sprays into both nostrils 4 (four) times daily. 12/27/13  Yes Ozella Rocks, MD  cetirizine (ZYRTEC) 10 MG tablet Take 1 tablet (10 mg total) by mouth daily. 02/29/16   Barbaraann Barthel, MD  fluticasone (FLONASE) 50 MCG/ACT nasal spray Place 2 sprays into both nostrils at bedtime. 02/29/16   Barbaraann Barthel, MD  guaiFENesin (MUCINEX) 600 MG 12 hr tablet Take 2 tablets (1,200 mg total) by mouth 2 (two) times daily. 02/29/16   Barbaraann Barthel, MD  ibuprofen (ADVIL,MOTRIN) 800 MG tablet Take 1 tablet (800 mg total) by mouth every 8 (eight) hours as needed. 11/26/14   Charlestine Night, PA-C  loratadine (CLARITIN) 10 MG tablet Take 1 tablet (10 mg total) by mouth daily. Patient not taking: Reported on 11/08/2015 01/19/14   Quentin Angst, MD  Olopatadine HCl 0.2 % SOLN Apply 1 drop to eye daily. Patient not taking: Reported on 11/08/2015 12/27/13  Ozella Rocksavid J Merrell, MD  pantoprazole (PROTONIX) 40 MG tablet Take 1 tablet (40 mg total) by mouth daily. Patient not taking: Reported on 11/08/2015 11/21/13   Richarda OverlieNayana Abrol, MD   Meds Ordered and Administered this Visit  Medications - No data to display  BP 122/79 mmHg  Pulse 81  Temp(Src) 98.3 F (36.8 C) (Oral)  Resp 12  SpO2 100%  LMP 02/04/2016 (Exact Date) No data found.   Physical Exam  Constitutional: She appears well-developed and well-nourished. No distress.  HENT:   Head: Normocephalic and atraumatic.  Right Ear: External ear normal.  Left Ear: External ear normal.  Mouth/Throat: Oropharynx is clear and moist. No oropharyngeal exudate.  Watery rhinorrhea; boggy nasal mucosa bilaterally.  Mild frontal and maxillary sinus tenderness with palpation.   Eyes: Conjunctivae and EOM are normal. Pupils are equal, round, and reactive to light. Right eye exhibits no discharge. Left eye exhibits no discharge.  Neck: Normal range of motion. Neck supple.  Shotty anterior cervical adenopathy  Cardiovascular: Normal rate, regular rhythm and normal heart sounds.   No murmur heard. Pulmonary/Chest: Effort normal and breath sounds normal. No respiratory distress. She has no wheezes. She has no rales. She exhibits no tenderness.  Abdominal: Soft. There is no tenderness.  Skin: She is not diaphoretic.    ED Course  Procedures (including critical care time)  Labs Review Labs Reviewed - No data to display  Imaging Review No results found.   Visual Acuity Review  Right Eye Distance:   Left Eye Distance:   Bilateral Distance:    Right Eye Near:   Left Eye Near:    Bilateral Near:         MDM   1. Upper respiratory infection   2. Seasonal allergies    Patient with suspected viral URI; nothing to indicate lower respir tract infection. Symptomatic treatment with mucolytics, antihistamines, nasal steroids. NSAIDs and Tylenol prn for headache and sore throat. [facility] if not improved, or [facility]     Barbaraann BarthelJames O Geetika Laborde, MD 02/29/16 [phone]

## 2016-04-22 ENCOUNTER — Encounter (INDEPENDENT_AMBULATORY_CARE_PROVIDER_SITE_OTHER): Payer: Self-pay

## 2016-04-22 ENCOUNTER — Ambulatory Visit: Payer: Self-pay | Attending: Internal Medicine
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
## 2017-03-24 ENCOUNTER — Ambulatory Visit: Payer: Self-pay | Attending: Internal Medicine

## 2017-03-25 ENCOUNTER — Ambulatory Visit: Payer: Self-pay | Attending: Physician Assistant | Admitting: Physician Assistant

## 2017-03-25 VITALS — BP 117/73 | HR 83 | Temp 98.0°F | Resp 18 | Ht 63.0 in | Wt 154.6 lb

## 2017-03-25 DIAGNOSIS — R05 Cough: Secondary | ICD-10-CM | POA: Insufficient documentation

## 2017-03-25 DIAGNOSIS — J302 Other seasonal allergic rhinitis: Secondary | ICD-10-CM | POA: Insufficient documentation

## 2017-03-25 DIAGNOSIS — R52 Pain, unspecified: Secondary | ICD-10-CM | POA: Insufficient documentation

## 2017-03-25 DIAGNOSIS — Z131 Encounter for screening for diabetes mellitus: Secondary | ICD-10-CM | POA: Insufficient documentation

## 2017-03-25 DIAGNOSIS — Z79899 Other long term (current) drug therapy: Secondary | ICD-10-CM | POA: Insufficient documentation

## 2017-03-25 MED ORDER — CETIRIZINE HCL 10 MG PO TABS: 10.0000 mg | ORAL_TABLET | Freq: Every day | ORAL | 3 refills | Status: DC

## 2017-03-25 MED FILL — ?CETIRIZINE HCL 10 MG TABLE: 10 | 30 days supply | Qty: 30 | Fill #0

## 2017-03-25 NOTE — Progress Notes (Signed)
Heidi Ellis, is a 41 y.o. female  WUJ:[id number]  [id number]  DOB - 04/14/1976  Subjective:  Chief Complaint and HPI: Heidi Ellis is a 41 y.o. female here today for allergies.  If she isn't taking zyrtec, she develops runny nose and dry, scratchy throat, and cough.  No f/c.    She also c/o LLB pain and L leg aches for about 3 months that are usu relieved with Ibuprofen.  NKI.  Pain is worse after rest/when trying to stand up.  No numbness/weakness/paresthesias.  No urinary s/sx.   No labs since January 2017  ROS:   Constitutional:  No f/c, No night sweats, No unexplained weight loss. EENT:  No vision changes, No blurry vision, No hearing changes. No mouth, throat, or ear problems other than as described above Respiratory: minimal cough, No SOB Cardiac: No CP, no palpitations GI:  No abd pain, No N/V/D. GU: No Urinary s/sx Musculoskeletal: + left lower back pain/leg pain Neuro: No headache, no dizziness, no motor weakness.  Skin: No rash Endocrine:  No polydipsia. No polyuria.  Psych: Denies SI/HI  No problems updated.  ALLERGIES: No Known Allergies  PAST MEDICAL HISTORY: No past medical history on file.  MEDICATIONS AT HOME: Prior to Admission medications   Medication Sig Start Date End Date Taking? Authorizing Provider  cetirizine (ZYRTEC) 10 MG tablet Take 1 tablet (10 mg total) by mouth daily. 03/25/17   Anders SimmondsMcClung, Chabely Norby M, PA-C  fluticasone (FLONASE) 50 MCG/ACT nasal spray Place 2 sprays into both nostrils at bedtime. 02/29/16   Barbaraann BarthelBreen, James O, MD  guaiFENesin (MUCINEX) 600 MG 12 hr tablet Take 2 tablets (1,200 mg total) by mouth 2 (two) times daily. 02/29/16   Barbaraann BarthelBreen, James O, MD  ibuprofen (ADVIL,MOTRIN) 800 MG tablet Take 1 tablet (800 mg total) by mouth every 8 (eight) hours as needed. 11/26/14   Lawyer, Cristal Deerhristopher, PA-C  ipratropium (ATROVENT) 0.06 % nasal spray Place 2 sprays into both nostrils 4 (four) times daily. 12/27/13   Ozella RocksMerrell, David J, MD    loratadine (CLARITIN) 10 MG tablet Take 1 tablet (10 mg total) by mouth daily. Patient not taking: Reported on 11/08/2015 01/19/14   Quentin AngstJegede, Olugbemiga E, MD  Olopatadine HCl 0.2 % SOLN Apply 1 drop to eye daily. Patient not taking: Reported on 11/08/2015 12/27/13   Ozella RocksMerrell, David J, MD  pantoprazole (PROTONIX) 40 MG tablet Take 1 tablet (40 mg total) by mouth daily. Patient not taking: Reported on 11/08/2015 11/21/13   Richarda OverlieAbrol, Nayana, MD     Objective:  EXAM:   Vitals:   03/25/17 1042  BP: 117/73  Pulse: 83  Resp: 18  Temp: 98 F (36.7 C)  TempSrc: Oral  SpO2: 100%  Weight: 154 lb 9.6 oz (70.1 kg)  Height: 5\' 3"  (1.6 m)    General appearance : A&OX3. NAD. Non-toxic-appearing HEENT: Atraumatic and Normocephalic.  PERRLA. EOM intact.  TM clear B. Mouth-MMM, post pharynx WNL w/o erythema, No PND. Neck: supple, no JVD. No cervical lymphadenopathy. No thyromegaly Chest/Lungs:  Breathing-non-labored, Good air entry bilaterally, breath sounds normal without rales, rhonchi, or wheezing  CVS: S1 S2 regular, no murmurs, gallops, rubs  Back: Thoracolumbar curvature.  Full S&ROM.  Mild TTP of paraspinus muscles on L lower back.  L hip/leg examined without any gross abnormality.  L hip with ~80% normal ROM.  DTR=B. Extremities: Bilateral Lower Ext shows no edema, both legs are warm to touch with = pulse throughout Neurology:  CN II-XII grossly intact, Non focal.  Psych:  TP linear. J/I WNL. Normal speech. Appropriate eye contact and affect.  Skin:  No Rash  Data Review Lab Results  Component Value Date   HGBA1C 5.50 11/08/2015   HGBA1C 5.6 % 03/18/2013     Assessment & Plan   1. Seasonal allergic rhinitis, unspecified trigger - cetirizine (ZYRTEC) 10 MG tablet; Take 1 tablet (10 mg total) by mouth daily.  Dispense: 90 tablet; Refill: 3  2. Body aches Use ibuprofen as needed per pkg since this tends to help.  Stretching/yoga/exercise recommended. - Vitamin D, 25-hydroxy - Basic  metabolic panel - TSH  3. Screening for diabetes mellitus - Basic metabolic panel   Patient have been counseled extensively about nutrition and exercise  Return in about 6 months (around 09/24/2017) for CPE- with Dr Hyman Hopes or assign new PCP.  The patient was given clear instructions to go to ER or return to medical center if symptoms don't improve, worsen or new problems develop. The patient verbalized understanding. The patient was told to call to get lab results if they haven't heard anything in the next week.     Georgian Co, PA-C [facility] [place], [place] [phone]   03/25/2017, 10:54 AMPatient ID: Heidi Mail, female   DOB: [birth year], 41 y.o.   MRN: [id number]

## 2017-03-25 NOTE — Progress Notes (Signed)
Patient is here for allergies Bone pain

## 2017-03-26 LAB — BASIC METABOLIC PANEL
BUN/Creatinine Ratio: 17 (ref 9–23)
BUN: 11 mg/dL (ref 6–24)
CALCIUM: 9.3 mg/dL (ref 8.7–10.2)
CHLORIDE: 103 mmol/L (ref 96–106)
CO2: 23 mmol/L (ref 20–29)
Creatinine, Ser: 0.64 mg/dL (ref 0.57–1.00)
GFR calc non Af Amer: 112 mL/min/{1.73_m2} (ref >59)
GFR, EST AFRICAN AMERICAN: 129 mL/min/{1.73_m2} (ref >59)
GLUCOSE: 111 mg/dL — AB (ref 65–99)
POTASSIUM: 3.9 mmol/L (ref 3.5–5.2)
Sodium: 139 mmol/L (ref 134–144)

## 2017-03-26 LAB — VITAMIN D 25 HYDROXY (VIT D DEFICIENCY, FRACTURES): VIT D 25 HYDROXY: 25.4 ng/mL — AB (ref 30.0–100.0)

## 2017-03-26 LAB — TSH: TSH: 1.14 u[IU]/mL (ref 0.450–4.500)

## 2017-04-01 ENCOUNTER — Telehealth: Payer: Self-pay | Admitting: *Deleted

## 2017-04-01 NOTE — Telephone Encounter (Signed)
Name and DOB verified.  Pt informed of lab results. Verbalized understanding.   Interpreter : Coralee Northina  [phone] used for assistance

## 2017-04-14 ENCOUNTER — Telehealth: Payer: Self-pay

## 2017-04-14 NOTE — Telephone Encounter (Signed)
CMA call regarding lab results   Patient did not answer but left a VM stating the reason of the call & to call back  

## 2017-04-14 NOTE — Telephone Encounter (Signed)
-----   Message from MaconNubia K [place], [place] sent at 04/13/2017  4:51 PM EDT ----- Please inform patient of needing to control her blood sugar by limiting her sugar and carb intake. Please inform patient of vitamin d being low and needing to pick up an OTC vitamin 2000 units. A recheck of labs will be completed at the next visit. ----- Message ----- From: Bary RichardMcClung, Angela M, PA-C Sent: 03/27/2017   9:26 AM To: Margaretmary LombardNubia K Lisbon, CMA  Please call patient.  Her labs show slightly elevated blood sugar.  Cut down on sugars and white carbs to decrease risk of developing diabetes.  We will recheck this periodically.  Her vitamin D is a little low.  She should get OTC vitamin D 2000 units and take this daily.  We will recheck this in a few months.  Thanks, Georgian CoAngela McClung, PA-C

## 2019-09-01 ENCOUNTER — Other Ambulatory Visit: Payer: Self-pay

## 2019-09-01 ENCOUNTER — Ambulatory Visit: Payer: Self-pay | Attending: Family Medicine | Admitting: Family Medicine

## 2019-09-01 ENCOUNTER — Encounter: Payer: Self-pay | Admitting: Family Medicine

## 2019-09-01 VITALS — BP 111/73 | HR 75 | Temp 98.2°F | Ht 65.0 in | Wt 148.4 lb

## 2019-09-01 DIAGNOSIS — N3000 Acute cystitis without hematuria: Secondary | ICD-10-CM

## 2019-09-01 DIAGNOSIS — R102 Pelvic and perineal pain: Secondary | ICD-10-CM

## 2019-09-01 DIAGNOSIS — R1024 Suprapubic pain: Secondary | ICD-10-CM

## 2019-09-01 DIAGNOSIS — G44209 Tension-type headache, unspecified, not intractable: Secondary | ICD-10-CM

## 2019-09-01 DIAGNOSIS — Z1231 Encounter for screening mammogram for malignant neoplasm of breast: Secondary | ICD-10-CM

## 2019-09-01 DIAGNOSIS — J302 Other seasonal allergic rhinitis: Secondary | ICD-10-CM

## 2019-09-01 DIAGNOSIS — R0789 Other chest pain: Secondary | ICD-10-CM

## 2019-09-01 LAB — POCT URINALYSIS DIP (CLINITEK)
Bilirubin, UA: NEGATIVE
Blood, UA: NEGATIVE
Glucose, UA: NEGATIVE mg/dL
Ketones, POC UA: NEGATIVE mg/dL
Nitrite, UA: NEGATIVE
POC PROTEIN,UA: NEGATIVE
Spec Grav, UA: 1.015
Urobilinogen, UA: 0.2 U/dL
pH, UA: 6

## 2019-09-01 MED ORDER — ALBUTEROL SULFATE HFA 108 (90 BASE) MCG/ACT IN AERS: 2.0000 | INHALATION_SPRAY | Freq: Four times a day (QID) | RESPIRATORY_TRACT | 3 refills | Status: DC | PRN

## 2019-09-01 MED ORDER — CYCLOBENZAPRINE HCL 5 MG PO TABS: 5.0000 mg | ORAL_TABLET | Freq: Three times a day (TID) | ORAL | 1 refills | Status: DC | PRN

## 2019-09-01 MED ORDER — CETIRIZINE HCL 10 MG PO TABS: 10.0000 mg | ORAL_TABLET | Freq: Every day | ORAL | 3 refills | Status: DC

## 2019-09-01 MED ORDER — SULFAMETHOXAZOLE-TRIMETHOPRIM 800-160 MG PO TABS: 1.0000 | ORAL_TABLET | Freq: Two times a day (BID) | ORAL | 0 refills | Status: AC

## 2019-09-01 MED FILL — CYCLOBENZAPRINE 5 MG TABLET: 5 | 10 days supply | Qty: 30 | Fill #0

## 2019-09-01 MED FILL — !VENTOLIN HFA INHALER: 108 (90 BAS | 25 days supply | Qty: 18 | Fill #0

## 2019-09-01 MED FILL — SULFAMETHOXAZOLE-TMP DS TAB: 800-160 | 5 days supply | Qty: 10 | Fill #0

## 2019-09-01 NOTE — Progress Notes (Signed)
Established Patient Office Visit  Subjective:  Patient ID: Heidi Ellis, female    DOB: August 19, 1976  Age: 43 y.o. MRN: [id number]  CC: Re-establish care  Stratus video interpretation system was used at today's visit secondary to language barrier   HPI Heidi Ellis, 43 year old Hispanic female who was last seen in office in 06/132018, presents to reestablish care.  She reports that she is having recurrent issues with upper and lower back pain, recurrent headaches at the top of her neck/posterior scalp, issues with chest discomfort/sensation of chest tightness and occasional nonproductive cough.  She reports chronic issues with allergic rhinitis for which she has had some recent increase in nasal congestion due to changes in the weather.  She reports that she feels fatigued.  She also states that she has never had a mammogram and wonders if she can be referred to have this done.  She denies any breast pain or any abnormalities on self breast exam.  She denies any dizziness, focal numbness or weakness and no slurred speech or visual disturbances associated with her headaches.  No sensitivity to light or sound/noise during headaches.  No nausea associated with her headaches.  Headaches have been off and on for the past 3 to 4 months.  Headaches usually resolve with use of over the counter advil.           She denies any current cough or shortness of breath.  She has had issues in the past with a recurrent cough, especially with changes in the weather.  She has longstanding issues with allergic rhinitis and is not currently on medication.  She has had increased nasal congestion and postnasal drainage with recent weather changes.  She also reports that she has been having some chest discomfort which can be bilateral or left-sided.  She also reports occasional pain in the left arm but this can occur with or without her chest discomfort.  She believes that her lower and upper back pain may be  related to her work.  She denies any family history of heart disease.  She denies any numbness or tingling in either hand.  Chest pain does not radiate to her back, neck/throat or jaw.  She does not get any diaphoresis with the chest discomfort.  She feels as if it is more of a tightness as if she cannot take a deep breath.  Symptoms do go away if she sits down and takes slow breaths.  Her last episode of chest discomfort occurred about 6 months ago.   Past Medical History:  Diagnosis Date  . Allergic rhinitis     Past Surgical History:  Procedure Laterality Date  . TUBAL LIGATION      Family History  Problem Relation Age of Onset  . Asthma Mother   . Cancer Neg Hx     Social History   Socioeconomic History  . Marital status: Single    Spouse name: Not on file  . Number of children: Not on file  . Years of education: Not on file  . Highest education level: Not on file  Occupational History  . Not on file  Social Needs  . Financial resource strain: Not on file  . Food insecurity    Worry: Not on file    Inability: Not on file  . Transportation needs    Medical: Not on file    Non-medical: Not on file  Tobacco Use  . Smoking status: Never Smoker  . Smokeless tobacco: Never Used  Substance  and Sexual Activity  . Alcohol use: No  . Drug use: No  . Sexual activity: Not Currently    Birth control/protection: None, Surgical  Lifestyle  . Physical activity    Days per week: Not on file    Minutes per session: Not on file  . Stress: Not on file  Relationships  . Social Musician on phone: Not on file    Gets together: Not on file    Attends religious service: Not on file    Active member of club or organization: Not on file    Attends meetings of clubs or organizations: Not on file    Relationship status: Not on file  . Intimate partner violence    Fear of current or ex partner: Not on file    Emotionally abused: Not on file    Physically abused: Not on  file    Forced sexual activity: Not on file  Other Topics Concern  . Not on file  Social History Narrative  . Not on file    Outpatient Medications Prior to Visit  Medication Sig Dispense Refill  . fluticasone (FLONASE) 50 MCG/ACT nasal spray Place 2 sprays into both nostrils at bedtime. (Patient not taking: Reported on 09/01/2019) 16 g 0  . guaiFENesin (MUCINEX) 600 MG 12 hr tablet Take 2 tablets (1,200 mg total) by mouth 2 (two) times daily. (Patient not taking: Reported on 09/01/2019) 20 tablet 0  . ibuprofen (ADVIL,MOTRIN) 800 MG tablet Take 1 tablet (800 mg total) by mouth every 8 (eight) hours as needed. (Patient not taking: Reported on 09/01/2019) 21 tablet 0  . ipratropium (ATROVENT) 0.06 % nasal spray Place 2 sprays into both nostrils 4 (four) times daily. (Patient not taking: Reported on 09/01/2019) 15 mL 12  . loratadine (CLARITIN) 10 MG tablet Take 1 tablet (10 mg total) by mouth daily. (Patient not taking: Reported on 11/08/2015) 30 tablet 3  . Olopatadine HCl 0.2 % SOLN Apply 1 drop to eye daily. (Patient not taking: Reported on 11/08/2015) 2.5 mL 0  . pantoprazole (PROTONIX) 40 MG tablet Take 1 tablet (40 mg total) by mouth daily. (Patient not taking: Reported on 11/08/2015) 30 tablet 3  . cetirizine (ZYRTEC) 10 MG tablet Take 1 tablet (10 mg total) by mouth daily. (Patient not taking: Reported on 09/01/2019) 90 tablet 3   No facility-administered medications prior to visit.     No Known Allergies  ROS Review of Systems  Constitutional: Positive for fatigue. Negative for chills and fever.  HENT: Positive for postnasal drip, rhinorrhea and sneezing. Negative for ear pain, nosebleeds, sinus pressure, sinus pain, sore throat and trouble swallowing.   Eyes: Negative for photophobia and visual disturbance.  Respiratory: Positive for cough (Occasional, nonproductive). Negative for shortness of breath and wheezing.   Cardiovascular: Positive for chest pain (Occasional sensation of  chest pressure). Negative for palpitations and leg swelling.  Gastrointestinal: Negative for abdominal pain (No current abdominal pain but did have some lower abdominal pain about 6 months ago), constipation, diarrhea, nausea and vomiting.  Endocrine: Negative for cold intolerance, heat intolerance, polydipsia, polyphagia and polyuria.  Genitourinary: Negative for dysuria, frequency and menstrual problem.  Musculoskeletal: Positive for arthralgias, back pain and neck pain.  Skin: Negative for rash and wound.  Neurological: Positive for headaches. Negative for dizziness.  Hematological: Negative for adenopathy. Does not bruise/bleed easily.  Psychiatric/Behavioral: Negative for self-injury and suicidal ideas. The patient is nervous/anxious (Anxious about health).  Objective:    Physical Exam  Constitutional: She is oriented to person, place, and time. She appears well-developed and well-nourished.  Neck: Normal range of motion. Neck supple. No JVD present. No thyromegaly present.  Patient with tenderness at the bilateral occipital ridges and patient with mild to moderate cervical paraspinous spasm  Cardiovascular: Normal rate.  Pulmonary/Chest: Effort normal and breath sounds normal.  Abdominal: Soft. There is abdominal tenderness (Mild suprapubic discomfort on exam). There is no rebound and no guarding.  Musculoskeletal: Normal range of motion.        General: Tenderness present. No edema.     Comments: No CVA tenderness, patient with posterior cervical and bilateral upper back/trapezius spasm and discomfort to palpation.  Patient with negative empty can sign/impingement sign at the shoulders, no indication of rotator cuff pathology on exam  Lymphadenopathy:    She has no cervical adenopathy.  Neurological: She is alert and oriented to person, place, and time.  Skin: Skin is warm and dry.  Psychiatric: She has a normal mood and affect. Her behavior is normal.  Overall mood was  normal though patient did appear to be slightly anxious at times  Nursing note and vitals reviewed.   BP 111/73 (BP Location: Left Arm, Patient Position: Sitting, Cuff Size: Normal)   Pulse 75   Temp 98.2 F (36.8 C) (Oral)   Ht  (1.651 m)   Wt 148 lb 6.4 oz (67.3 kg)   LMP 08/17/2019 (LMP Unknown)   SpO2 100%   BMI 24.70 kg/m  Wt Readings from Last 3 Encounters:  09/01/19 148 lb 6.4 oz (67.3 kg)  03/25/17 154 lb 9.6 oz (70.1 kg)  11/08/15 148 lb (67.1 kg)     Health Maintenance Due  Topic Date Due  . PAP SMEAR-Modifier  11/07/2018   Health maintenance discussed with the patient as she has been encouraged to schedule well exam/exam for screening for cervical cancer/Pap smear.  Lab Results  Component Value Date   TSH 1.140 03/25/2017   Lab Results  Component Value Date   WBC 5.1 11/08/2015   HGB 10.6 (L) 11/08/2015   HCT 34.6 (L) 11/08/2015   MCV 77.1 (L) 11/08/2015   PLT 300 11/08/2015   Lab Results  Component Value Date   NA 139 03/25/2017   K 3.9 03/25/2017   CO2 23 03/25/2017   GLUCOSE 111 (H) 03/25/2017   BUN 11 03/25/2017   CREATININE 0.64 03/25/2017   BILITOT 0.8 11/08/2015   ALKPHOS 48 11/08/2015   AST 18 11/08/2015   ALT 15 11/08/2015   PROT 7.1 11/08/2015   ALBUMIN 4.3 11/08/2015   CALCIUM 9.3 03/25/2017   ANIONGAP 4 (L) 11/26/2014   Lab Results  Component Value Date   CHOL 201 (H) 11/08/2015   Lab Results  Component Value Date   HDL 48 11/08/2015   Lab Results  Component Value Date   LDLCALC 125 11/08/2015   Lab Results  Component Value Date   TRIG 139 11/08/2015   Lab Results  Component Value Date   CHOLHDL 4.2 11/08/2015   Lab Results  Component Value Date   HGBA1C 5.50 11/08/2015      Assessment & Plan:  1. Chest discomfort Patient with complaint of issues with chest discomfort/chest tightness.  EKG done at today's visit was normal.  She does report history of allergic rhinitis which was sometimes result in  shortness of breath/chest tightness with changes in temperature such as exposure to cold or with exposure to environmental  substances such as smoke.  Prescription provided for albuterol to use as needed for shortness of breath and refill provided of cetirizine for allergic rhinitis.  Also discussed with the patient that she may have cough variant asthma as she does have family history of mother with asthma.  Patient also has had issues in the past with acid reflux and if she continues to have chest discomfort that she may need to be started on proton pump inhibitor therapy to see if this helps/eliminates her symptoms.  Patient is encouraged to return to the office or seek medical attention if she continues to have chest discomfort.  For severe chest pain, shortness of breath/difficulty breathing she should go to the ED for further evaluation and treatment. - EKG 12-Lead - albuterol (VENTOLIN HFA) 108 (90 Base) MCG/ACT inhaler; Inhale 2 puffs into the lungs every 6 (six) hours as needed for wheezing or shortness of breath.  Dispense: 8 g; Refill: 3  2. Tension headache Patient with report of recurrent headaches which occur at the back of the neck and on examination, she has tenderness at the posterior occipital ridges bilaterally as well as posterior cervical paraspinous and upper back/trapezius spasm.  Discussed with the patient that her headaches are likely related to tension/muscle spasm and prescription provided for Flexeril 5 mg to take up to 3 times daily as needed for headache/muscle spasm and she may also use warm moist heat to the affected areas - cyclobenzaprine (FLEXERIL) 5 MG tablet; Take 1 tablet (5 mg total) by mouth 3 (three) times daily as needed for muscle spasms. In neck area/headaches  Dispense: 30 tablet; Refill: 1  3. Suprapubic discomfort Patient with suprapubic discomfort on examination and patient will have urinalysis to look for possible urinary tract infection.  We will also send  urinalysis for culture. - POCT URINALYSIS DIP (CLINITEK) - Urine Culture  6. Encounter for screening mammogram for breast cancer Order placed for screening mammogram and patient was given information on applying for scholarship to help with the cost of the mammogram.  5. Seasonal allergic rhinitis, unspecified trigger Prescription provided for cetirizine for continued treatment of allergic rhinitis. - cetirizine (ZYRTEC) 10 MG tablet; Take 1 tablet (10 mg total) by mouth daily.  Dispense: 90 tablet; Refill: 3  4. Acute cystitis without hematuria Patient with suprapubic tenderness on examination and had abnormal urinalysis suggestive of urinary tract infection.  Prescription provided for Bactrim DS twice daily x5 days and urine sent for culture.  She will be notified if a change in antibiotic therapy is warranted based on the urine culture results. - sulfamethoxazole-trimethoprim (BACTRIM DS) 800-160 MG tablet; Take 1 tablet by mouth 2 (two) times daily for 5 days.  Dispense: 10 tablet; Refill: 0 - Urine Culture  Meds ordered this encounter  Medications  . cetirizine (ZYRTEC) 10 MG tablet    Sig: Take 1 tablet (10 mg total) by mouth daily.    Dispense:  90 tablet    Refill:  3  . albuterol (VENTOLIN HFA) 108 (90 Base) MCG/ACT inhaler    Sig: Inhale 2 puffs into the lungs every 6 (six) hours as needed for wheezing or shortness of breath.    Dispense:  8 g    Refill:  3  . cyclobenzaprine (FLEXERIL) 5 MG tablet    Sig: Take 1 tablet (5 mg total) by mouth 3 (three) times daily as needed for muscle spasms. In neck area/headaches    Dispense:  30 tablet    Refill:  1  .  sulfamethoxazole-trimethoprim (BACTRIM DS) 800-160 MG tablet    Sig: Take 1 tablet by mouth 2 (two) times daily for 5 days.    Dispense:  10 tablet    Refill:  0   An After Visit Summary was printed and given to the patient.  Follow-up: Return in about 6 weeks (around 10/13/2019) for well exam.    Cain Saupe, MD

## 2019-09-01 NOTE — Progress Notes (Signed)
Pt. Is here to establish care as a new patient.   Pt. Stated sometimes she get chest pain and hard for her to breathe.

## 2019-09-03 ENCOUNTER — Encounter: Payer: Self-pay | Admitting: Family Medicine

## 2019-09-03 LAB — URINE CULTURE

## 2019-09-06 ENCOUNTER — Other Ambulatory Visit: Payer: Self-pay | Admitting: Family Medicine

## 2019-09-07 ENCOUNTER — Telehealth: Payer: Self-pay | Admitting: *Deleted

## 2019-09-07 NOTE — Telephone Encounter (Signed)
-----   Message from Antony Blackbird, MD sent at 09/06/2019  4:27 PM EST ----- 5 day supply of bactrim was previously sent to pharmacy and patient should call or return to clinic is she continues to have symptoms

## 2019-09-07 NOTE — Telephone Encounter (Signed)
Medical Assistant left message on patient's home and cell voicemail. Voicemail states to give a call back to [place] with [facility] at [phone]. Please let patient know she has a treatment for UTI at the pharmacy

## 2019-09-12 ENCOUNTER — Ambulatory Visit: Payer: Self-pay

## 2019-10-17 ENCOUNTER — Ambulatory Visit: Payer: Self-pay | Admitting: Family Medicine

## 2019-10-19 ENCOUNTER — Other Ambulatory Visit: Payer: Self-pay

## 2019-10-19 ENCOUNTER — Telehealth: Payer: Self-pay | Admitting: Family Medicine

## 2019-10-19 ENCOUNTER — Ambulatory Visit: Payer: Self-pay

## 2019-10-19 NOTE — Telephone Encounter (Signed)
I called Pt since received an email Patient checking account shows money being deposited into a savings account ending in [phone].Marland Kitchen Patient would need to provide 90 days of complete bank statements for this account to complete FA process.. will hold account for 14 days for patient to provide requested information to complete CAFA application., LVM to Pt

## 2019-10-20 ENCOUNTER — Other Ambulatory Visit: Payer: Self-pay

## 2019-10-20 ENCOUNTER — Encounter: Payer: Self-pay | Admitting: Family Medicine

## 2019-10-20 ENCOUNTER — Ambulatory Visit: Payer: Self-pay | Attending: Family Medicine | Admitting: Family Medicine

## 2019-10-20 VITALS — BP 103/67 | HR 65 | Temp 98.0°F | Ht 65.0 in | Wt 147.2 lb

## 2019-10-20 DIAGNOSIS — N898 Other specified noninflammatory disorders of vagina: Secondary | ICD-10-CM

## 2019-10-20 DIAGNOSIS — Z124 Encounter for screening for malignant neoplasm of cervix: Secondary | ICD-10-CM

## 2019-10-20 DIAGNOSIS — Z1231 Encounter for screening mammogram for malignant neoplasm of breast: Secondary | ICD-10-CM

## 2019-10-20 DIAGNOSIS — Z0000 Encounter for general adult medical examination without abnormal findings: Secondary | ICD-10-CM

## 2019-10-20 DIAGNOSIS — Z13220 Encounter for screening for lipoid disorders: Secondary | ICD-10-CM

## 2019-10-20 NOTE — Progress Notes (Signed)
Established Patient Office Visit  Subjective:  Patient ID: Heidi Ellis, female    DOB: 08-May-1976  Age: 44 y.o. MRN: [id number]  CC:  Chief Complaint  Patient presents with  . Annual Exam    HPI Heidi Ellis, 44 year old female who presents for annual well exam.  She reports no history of abnormal Pap smears.  She believes that her last Pap smear was within the past 3 to 4 years. (On review of chart, patient with normal Pap smear done 11/08/2015).  She denies any current abdominal, pelvic or vaginal pain.  She has noticed some recent vaginal discharge and would like to have testing of the vaginal discharge to see if she may have bacterial vaginitis, yeast infection or STI.  She has had no urinary frequency, urgency or dysuria.  She denies any issues with headaches or dizziness, no chest pain or palpitations.  She has noticed no abnormalities on self breast exam.  She is nonfasting at today's visit.  Past Medical History:  Diagnosis Date  . Allergic rhinitis     Past Surgical History:  Procedure Laterality Date  . TUBAL LIGATION      Family History  Problem Relation Age of Onset  . Asthma Mother   . Cancer Neg Hx   . Heart disease Neg Hx     Social History   Socioeconomic History  . Marital status: Single    Spouse name: Not on file  . Number of children: Not on file  . Years of education: Not on file  . Highest education level: Not on file  Occupational History  . Not on file  Tobacco Use  . Smoking status: Never Smoker  . Smokeless tobacco: Never Used  Substance and Sexual Activity  . Alcohol use: No  . Drug use: No  . Sexual activity: Not Currently    Birth control/protection: None, Surgical  Other Topics Concern  . Not on file  Social History Narrative  . Not on file   Social Determinants of Health   Financial Resource Strain:   . Difficulty of Paying Living Expenses: Not on file  Food Insecurity:   . Worried About Charity fundraiser in  the Last Year: Not on file  . Ran Out of Food in the Last Year: Not on file  Transportation Needs:   . Lack of Transportation (Medical): Not on file  . Lack of Transportation (Non-Medical): Not on file  Physical Activity:   . Days of Exercise per Week: Not on file  . Minutes of Exercise per Session: Not on file  Stress:   . Feeling of Stress : Not on file  Social Connections:   . Frequency of Communication with Friends and Family: Not on file  . Frequency of Social Gatherings with Friends and Family: Not on file  . Attends Religious Services: Not on file  . Active Member of Clubs or Organizations: Not on file  . Attends Archivist Meetings: Not on file  . Marital Status: Not on file  Intimate Partner Violence:   . Fear of Current or Ex-Partner: Not on file  . Emotionally Abused: Not on file  . Physically Abused: Not on file  . Sexually Abused: Not on file    Outpatient Medications Prior to Visit  Medication Sig Dispense Refill  . albuterol (VENTOLIN HFA) 108 (90 Base) MCG/ACT inhaler Inhale 2 puffs into the lungs every 6 (six) hours as needed for wheezing or shortness of breath. 8 g 3  .  cetirizine (ZYRTEC) 10 MG tablet Take 1 tablet (10 mg total) by mouth daily. 90 tablet 3  . cyclobenzaprine (FLEXERIL) 5 MG tablet Take 1 tablet (5 mg total) by mouth 3 (three) times daily as needed for muscle spasms. In neck area/headaches 30 tablet 1  . fluticasone (FLONASE) 50 MCG/ACT nasal spray Place 2 sprays into both nostrils at bedtime. (Patient not taking: Reported on 10/20/2019) 16 g 0  . guaiFENesin (MUCINEX) 600 MG 12 hr tablet Take 2 tablets (1,200 mg total) by mouth 2 (two) times daily. (Patient not taking: Reported on 09/01/2019) 20 tablet 0  . ibuprofen (ADVIL,MOTRIN) 800 MG tablet Take 1 tablet (800 mg total) by mouth every 8 (eight) hours as needed. (Patient not taking: Reported on 09/01/2019) 21 tablet 0  . ipratropium (ATROVENT) 0.06 % nasal spray Place 2 sprays into both  nostrils 4 (four) times daily. (Patient not taking: Reported on 09/01/2019) 15 mL 12  . loratadine (CLARITIN) 10 MG tablet Take 1 tablet (10 mg total) by mouth daily. (Patient not taking: Reported on 11/08/2015) 30 tablet 3  . Olopatadine HCl 0.2 % SOLN Apply 1 drop to eye daily. (Patient not taking: Reported on 11/08/2015) 2.5 mL 0  . pantoprazole (PROTONIX) 40 MG tablet Take 1 tablet (40 mg total) by mouth daily. (Patient not taking: Reported on 11/08/2015) 30 tablet 3   No facility-administered medications prior to visit.    No Known Allergies  ROS Review of Systems  Constitutional: Negative for chills, fatigue and fever.  HENT: Positive for congestion (Occasional). Negative for ear pain, sinus pressure, sinus pain, sore throat and trouble swallowing.   Eyes: Negative for photophobia and visual disturbance.  Respiratory: Negative for cough and shortness of breath.   Cardiovascular: Negative for chest pain, palpitations and leg swelling.  Gastrointestinal: Negative for abdominal pain, blood in stool, constipation, diarrhea and nausea.  Endocrine: Negative for cold intolerance, heat intolerance, polydipsia, polyphagia and polyuria.  Genitourinary: Positive for vaginal discharge. Negative for dysuria, frequency, pelvic pain and vaginal pain.  Musculoskeletal: Negative for arthralgias and back pain.  Allergic/Immunologic: Positive for environmental allergies (Dust/pollen). Negative for food allergies and immunocompromised state.  Neurological: Negative for dizziness and headaches.  Hematological: Negative for adenopathy. Does not bruise/bleed easily.  Psychiatric/Behavioral: Negative for self-injury and suicidal ideas.      Objective:    Physical Exam  Constitutional: She is oriented to person, place, and time. She appears well-developed and well-nourished.  Well-nourished well-developed female in no acute distress, wearing mask as per office COVID-19 protocol  Eyes: Conjunctivae and EOM  are normal.  Neck: No JVD present. No thyromegaly present.  Cardiovascular: Normal rate and regular rhythm.  Pulmonary/Chest: Effort normal and breath sounds normal.  Normal breast exam, no axillary adenopathy, no palpable masses, no skin changes  Abdominal: Soft. There is no abdominal tenderness. There is no rebound and no guarding.  Genitourinary:    Vaginal discharge present.     Genitourinary Comments: Presence of whitish vaginal discharge, normal appearance to the cervix and vaginal canal.  No cervical motion tenderness, no adnexal tenderness or masses   Musculoskeletal:        General: No tenderness or edema.     Cervical back: Normal range of motion and neck supple.  Lymphadenopathy:    She has no cervical adenopathy.  Neurological: She is alert and oriented to person, place, and time.  Skin: Skin is warm and dry.  Psychiatric: She has a normal mood and affect. Her behavior is normal.  Nursing note and vitals reviewed.   BP 103/67 (BP Location: Left Arm, Patient Position: Sitting, Cuff Size: Normal)   Pulse 65   Temp 98 F (36.7 C) (Oral)   Ht 5\' 5"  (1.651 m)   Wt 147 lb 3.2 oz (66.8 kg)   LMP 10/02/2019   SpO2 100%   BMI 24.50 kg/m  Wt Readings from Last 3 Encounters:  10/20/19 147 lb 3.2 oz (66.8 kg)  09/01/19 148 lb 6.4 oz (67.3 kg)  03/25/17 154 lb 9.6 oz (70.1 kg)     Health Maintenance Due  Topic Date Due  . PAP SMEAR-Modifier  11/07/2018     Lab Results  Component Value Date   TSH 1.140 03/25/2017   Lab Results  Component Value Date   WBC 5.1 11/08/2015   HGB 10.6 (L) 11/08/2015   HCT 34.6 (L) 11/08/2015   MCV 77.1 (L) 11/08/2015   PLT 300 11/08/2015   Lab Results  Component Value Date   NA 139 03/25/2017   K 3.9 03/25/2017   CO2 23 03/25/2017   GLUCOSE 111 (H) 03/25/2017   BUN 11 03/25/2017   CREATININE 0.64 03/25/2017   BILITOT 0.8 11/08/2015   ALKPHOS 48 11/08/2015   AST 18 11/08/2015   ALT 15 11/08/2015   PROT 7.1 11/08/2015    ALBUMIN 4.3 11/08/2015   CALCIUM 9.3 03/25/2017   ANIONGAP 4 (L) 11/26/2014   Lab Results  Component Value Date   CHOL 201 (H) 11/08/2015   Lab Results  Component Value Date   HDL 48 11/08/2015   Lab Results  Component Value Date   LDLCALC 125 11/08/2015   Lab Results  Component Value Date   TRIG 139 11/08/2015   Lab Results  Component Value Date   CHOLHDL 4.2 11/08/2015   Lab Results  Component Value Date   HGBA1C 5.50 11/08/2015      Assessment & Plan:  1. Encounter for preventative adult health care examination Patient with preventative adult health care examination done at today's visit.  She additionally had Pap smear as a screening test for cervical cancer and order placed for  mammogram to screen for breast cancer.  She was provided with preventative health information/anticipatory guidance for her age group.  She will return for fasting lipid panel and fasting basic metabolic panel to screen for lipid disorder and diabetes.  Healthy diet and regular exercise encouraged. - Lipid panel; Future - Basic Metabolic Panel; Future  2. Screening for lipid disorders Patient will return for fasting lipid panel as a screening test for lipid disorders - Lipid panel; Future  3. Pap smear for cervical cancer screening Pap smear done at today's visit and patient will be notified of the results and if any further treatment is needed based on these results.  If Pap smear is normal, she should have repeat Pap smear in 3 years, sooner if abnormal. - Cytology - PAP  4. Vaginal discharge Patient with complaint of vaginal discharge and cervicovaginal ancillary testing done at today's visit.  She will be notified of the results and if any further treatment is needed based on the results - Cervicovaginal ancillary only  5. Encounter for screening mammogram for breast cancer Order placed for screening mammogram and patient provided with scholarship information to help with the cost -  MM Digital Screening; Future   An After Visit Summary was printed and given to the patient.  Follow-up: Return in about 6 months (around 04/18/2020) for chronic issues and as needed; fasting  labs at your convenience.    Cain Saupe, MD

## 2019-10-20 NOTE — Patient Instructions (Signed)
Preventive Care 40-44 Years Old, Female Preventive care refers to visits with your health care provider and lifestyle choices that can promote health and wellness. This includes:  A yearly physical exam. This may also be called an annual well check.  Regular dental visits and eye exams.  Immunizations.  Screening for certain conditions.  Healthy lifestyle choices, such as eating a healthy diet, getting regular exercise, not using drugs or products that contain nicotine and tobacco, and limiting alcohol use. What can I expect for my preventive care visit? Physical exam Your health care provider will check your:  Height and weight. This may be used to calculate body mass index (BMI), which tells if you are at a healthy weight.  Heart rate and blood pressure.  Skin for abnormal spots. Counseling Your health care provider may ask you questions about your:  Alcohol, tobacco, and drug use.  Emotional well-being.  Home and relationship well-being.  Sexual activity.  Eating habits.  Work and work environment.  Method of birth control.  Menstrual cycle.  Pregnancy history. What immunizations do I need?  Influenza (flu) vaccine  This is recommended every year. Tetanus, diphtheria, and pertussis (Tdap) vaccine  You may need a Td booster every 10 years. Varicella (chickenpox) vaccine  You may need this if you have not been vaccinated. Zoster (shingles) vaccine  You may need this after age 60. Measles, mumps, and rubella (MMR) vaccine  You may need at least one dose of MMR if you were born in 1957 or later. You may also need a second dose. Pneumococcal conjugate (PCV13) vaccine  You may need this if you have certain conditions and were not previously vaccinated. Pneumococcal polysaccharide (PPSV23) vaccine  You may need one or two doses if you smoke cigarettes or if you have certain conditions. Meningococcal conjugate (MenACWY) vaccine  You may need this if you  have certain conditions. Hepatitis A vaccine  You may need this if you have certain conditions or if you travel or work in places where you may be exposed to hepatitis A. Hepatitis B vaccine  You may need this if you have certain conditions or if you travel or work in places where you may be exposed to hepatitis B. Haemophilus influenzae type b (Hib) vaccine  You may need this if you have certain conditions. Human papillomavirus (HPV) vaccine  If recommended by your health care provider, you may need three doses over 6 months. You may receive vaccines as individual doses or as more than one vaccine together in one shot (combination vaccines). Talk with your health care provider about the risks and benefits of combination vaccines. What tests do I need? Blood tests  Lipid and cholesterol levels. These may be checked every 5 years, or more frequently if you are over 50 years old.  Hepatitis C test.  Hepatitis B test. Screening  Lung cancer screening. You may have this screening every year starting at age 55 if you have a 30-pack-year history of smoking and currently smoke or have quit within the past 15 years.  Colorectal cancer screening. All adults should have this screening starting at age 50 and continuing until age 75. Your health care provider may recommend screening at age 45 if you are at increased risk. You will have tests every 1-10 years, depending on your results and the type of screening test.  Diabetes screening. This is done by checking your blood sugar (glucose) after you have not eaten for a while (fasting). You may have this   done every 1-3 years.  Mammogram. This may be done every 1-2 years. Talk with your health care provider about when you should start having regular mammograms. This may depend on whether you have a family history of breast cancer.  BRCA-related cancer screening. This may be done if you have a family history of breast, ovarian, tubal, or peritoneal  cancers.  Pelvic exam and Pap test. This may be done every 3 years starting at age 25. Starting at age 78, this may be done every 5 years if you have a Pap test in combination with an HPV test. Other tests  Sexually transmitted disease (STD) testing.  Bone density scan. This is done to screen for osteoporosis. You may have this scan if you are at high risk for osteoporosis. Follow these instructions at home: Eating and drinking  Eat a diet that includes fresh fruits and vegetables, whole grains, lean protein, and low-fat dairy.  Take vitamin and mineral supplements as recommended by your health care provider.  Do not drink alcohol if: ? Your health care provider tells you not to drink. ? You are pregnant, may be pregnant, or are planning to become pregnant.  If you drink alcohol: ? Limit how much you have to 0-1 drink a day. ? Be aware of how much alcohol is in your drink. In the [place] one drink equals one 12 oz bottle of beer (355 mL), one 5 oz glass of wine (148 mL), or one 1 oz glass of hard liquor (44 mL). Lifestyle  Take daily care of your teeth and gums.  Stay active. Exercise for at least 30 minutes on 5 or more days each week.  Do not use any products that contain nicotine or tobacco, such as cigarettes, e-cigarettes, and chewing tobacco. If you need help quitting, ask your health care provider.  If you are sexually active, practice safe sex. Use a condom or other form of birth control (contraception) in order to prevent pregnancy and STIs (sexually transmitted infections).  If told by your health care provider, take low-dose aspirin daily starting at age 85. What's next?  Visit your health care provider once a year for a well check visit.  Ask your health care provider how often you should have your eyes and teeth checked.  Stay up to date on all vaccines. This information is not intended to replace advice given to you by your health care provider. Make sure you  discuss any questions you have with your health care provider. Document Revised: 06/10/2018 Document Reviewed: 06/10/2018 Elsevier Patient Education  2020 [employer].  Fibrocystic Breast Changes  Fibrocystic breast changes are changes that can make your breasts swollen or painful. These changes happen when tiny sacs of fluid (cysts) form in the breast. This is a common condition. It does not mean that you have cancer. It usually happens because of hormone changes during a monthly period. Follow these instructions at home:  Check your breasts after every monthly period. If you do not have monthly periods, check your breasts on the first day of every month. Check for: ? Soreness. ? New swelling or puffiness. ? A change in breast size. ? A change in a lump that was already there.  Take over-the-counter and prescription medicines only as told by your doctor.  Wear a support or sports bra that fits well. Wear this support especially when you are exercising.  Avoid or have less caffeine, fat, and sugar in what you eat and drink as told by  your doctor. Contact a doctor if:  You have fluid coming from your nipple, especially if the fluid has blood in it.  You have new lumps or bumps in your breast.  Your breast gets puffy, red, and painful.  You have changes in how your breast looks.  Your nipple looks flat or it sinks into your breast. Get help right away if:  Your breast turns red, and the redness is spreading. Summary  Fibrocystic breast changes are changes that can make your breasts swollen or painful.  This condition can happen when you have hormone changes during your monthly period.  With this condition, it is important to check your breasts after every monthly period. If you do not have monthly periods, check your breasts on the first day of every month. This information is not intended to replace advice given to you by your health care provider. Make sure you discuss any  questions you have with your health care provider. Document Revised: 01/20/2019 Document Reviewed: 06/12/2016 Elsevier Patient Education  2020 [employer].

## 2019-10-21 LAB — CERVICOVAGINAL ANCILLARY ONLY
Bacterial Vaginitis (gardnerella): NEGATIVE
Candida Glabrata: POSITIVE — AB
Candida Vaginitis: NEGATIVE
Chlamydia: NEGATIVE
Comment: NEGATIVE
Comment: NEGATIVE
Comment: NEGATIVE
Comment: NEGATIVE
Comment: NEGATIVE
Comment: NORMAL
Neisseria Gonorrhea: NEGATIVE
Trichomonas: NEGATIVE

## 2019-10-22 ENCOUNTER — Other Ambulatory Visit: Payer: Self-pay | Admitting: Family Medicine

## 2019-10-22 DIAGNOSIS — B3749 Other urogenital candidiasis: Secondary | ICD-10-CM

## 2019-10-22 MED ORDER — FLUCONAZOLE 150 MG PO TABS: 150.0000 mg | ORAL_TABLET | Freq: Once | ORAL | 1 refills | Status: AC

## 2019-10-24 ENCOUNTER — Telehealth: Payer: Self-pay | Admitting: *Deleted

## 2019-10-24 MED FILL — FLUCONAZOLE 150 MG TABLET: 150 | 1 days supply | Qty: 1 | Fill #0

## 2019-10-24 NOTE — Telephone Encounter (Signed)
Medical Assistant used [employer] to contact patient.  Interpreter Name: Standley Brooking #: [id number] Patient is aware of yeast being noted on her STI testing and a prescription being sent to her pharmacy for pick up.

## 2019-10-24 NOTE — Telephone Encounter (Signed)
-----   Message from Cain Saupe, MD sent at 10/22/2019  5:05 PM EST ----- Presence of yeast on cervicovaginal testing. RX will be sent to pharmacy for medication for treatment

## 2019-10-25 LAB — CYTOLOGY - PAP
Comment: NEGATIVE
Diagnosis: NEGATIVE
High risk HPV: NEGATIVE

## 2019-10-31 ENCOUNTER — Telehealth: Payer: Self-pay | Admitting: *Deleted

## 2019-10-31 NOTE — Telephone Encounter (Signed)
Medical Assistant used [employer] to contact patient.  Interpreter Name:Myra Interpreter #: [id number] Patient is aware of PAP being normal.

## 2019-10-31 NOTE — Telephone Encounter (Signed)
-----   Message from Cain Saupe, MD sent at 10/25/2019  4:59 PM EST ----- Normal Pap smear

## 2019-11-03 MED FILL — FLUCONAZOLE 150 MG TABLET: 150 | 1 days supply | Qty: 1 | Fill #0

## 2020-02-01 ENCOUNTER — Ambulatory Visit: Payer: Self-pay | Admitting: Family Medicine

## 2020-06-08 ENCOUNTER — Other Ambulatory Visit: Payer: Self-pay

## 2020-06-08 ENCOUNTER — Ambulatory Visit: Payer: Self-pay | Attending: Family Medicine

## 2020-07-10 ENCOUNTER — Other Ambulatory Visit: Payer: Self-pay

## 2020-07-10 ENCOUNTER — Encounter: Payer: Self-pay | Admitting: Family

## 2020-07-10 ENCOUNTER — Ambulatory Visit: Payer: Self-pay | Attending: Family | Admitting: Family

## 2020-07-10 VITALS — BP 121/67 | HR 71 | Temp 98.1°F | Resp 16 | Ht 64.0 in | Wt 143.0 lb

## 2020-07-10 DIAGNOSIS — Z0120 Encounter for dental examination and cleaning without abnormal findings: Secondary | ICD-10-CM

## 2020-07-10 DIAGNOSIS — G8929 Other chronic pain: Secondary | ICD-10-CM

## 2020-07-10 DIAGNOSIS — M545 Low back pain, unspecified: Secondary | ICD-10-CM

## 2020-07-10 DIAGNOSIS — M25561 Pain in right knee: Secondary | ICD-10-CM

## 2020-07-10 DIAGNOSIS — K219 Gastro-esophageal reflux disease without esophagitis: Secondary | ICD-10-CM

## 2020-07-10 MED ORDER — PANTOPRAZOLE SODIUM 20 MG PO TBEC: 20.0000 mg | DELAYED_RELEASE_TABLET | Freq: Every day | ORAL | 2 refills | Status: DC

## 2020-07-10 MED FILL — PANTOPRAZOLE SOD DR 20 MG T: 20 | 30 days supply | Qty: 30 | Fill #0

## 2020-07-10 NOTE — Progress Notes (Signed)
Needs Rf on Protonix  Wants flu vaccine today

## 2020-07-10 NOTE — Progress Notes (Signed)
Patient ID: Heidi Ellis, female    DOB: [birth year]  MRN: [id number]  CC: GERD Follow-Up  Subjective: Heidi Ellis is a 44 y.o. female with history of allergic rhinitis, dental caries, dysmenorrhea, and menorrhagia who presents for GERD follow-up.   1. GERD FOLLOW-UP: GERD control status: worse Satisfied with current treatment? reports she has never been on prescription medication Heartburn frequency: sometimes  Previous GERD medications: none Antacid use frequency: Tums every 2 or 3 days Location: throat burning and heartburn sometimes Alleviatiating factors:  Tums  Aggravating factors:h heavy foods, coffee, tight clothes  Dysphagia: no Odynophagia:  no Hematemesis: no Blood in stool: no EGD: no  2. KNEE PAIN: Duration: chronic Involved knee: right Mechanism of injury: denies  Location:diffuse Onset: gradual Severity: 5/10  Quality:  sharp Frequency: intermittent Radiation: yes Aggravating factors: walking  Alleviating factors: Ibuprofen  Status: stable Treatments attempted: ibuprofen  Relief with NSAIDs?:  moderate Weakness with weight bearing or walking: no Sensation of giving way: no Locking: yes sometimes Popping: yes  Bruising: no Swelling: yes Redness: no Paresthesias/decreased sensation: no Fevers: no  Requests referral to Orthopedics.  3. BACK PAIN: Location: lower back  Onset: chronic   Symptoms Worse with: standing Better with: Ibuprofen and back brace Trauma: denies Popping/clicking sounds with movement/bending: sometimes Muscle spasms: denies Requests referral to Orthopedics.  4. DENTIST REQUEST: Request referral to Dentist as she hasn't been in at least 3 years. Also, reports she has some concerns about some cavities she had repaired in the past.  Patient Active Problem List   Diagnosis Date Noted  . Encounter for annual physical exam 11/08/2015  . Pap smear for cervical cancer screening 11/08/2015  . Allergic rhinitis  01/19/2014  . Dental caries 01/19/2014  . Dysmenorrhea 05/04/2013  . Menorrhagia 05/04/2013     Current Outpatient Medications on File Prior to Visit  Medication Sig Dispense Refill  . albuterol (VENTOLIN HFA) 108 (90 Base) MCG/ACT inhaler Inhale 2 puffs into the lungs every 6 (six) hours as needed for wheezing or shortness of breath. 8 g 3  . cetirizine (ZYRTEC) 10 MG tablet Take 1 tablet (10 mg total) by mouth daily. 90 tablet 3  . cyclobenzaprine (FLEXERIL) 5 MG tablet Take 1 tablet (5 mg total) by mouth 3 (three) times daily as needed for muscle spasms. In neck area/headaches 30 tablet 1  . fluticasone (FLONASE) 50 MCG/ACT nasal spray Place 2 sprays into both nostrils at bedtime. (Patient not taking: Reported on 10/20/2019) 16 g 0  . guaiFENesin (MUCINEX) 600 MG 12 hr tablet Take 2 tablets (1,200 mg total) by mouth 2 (two) times daily. (Patient not taking: Reported on 09/01/2019) 20 tablet 0  . ibuprofen (ADVIL,MOTRIN) 800 MG tablet Take 1 tablet (800 mg total) by mouth every 8 (eight) hours as needed. (Patient not taking: Reported on 09/01/2019) 21 tablet 0  . ipratropium (ATROVENT) 0.06 % nasal spray Place 2 sprays into both nostrils 4 (four) times daily. (Patient not taking: Reported on 09/01/2019) 15 mL 12  . loratadine (CLARITIN) 10 MG tablet Take 1 tablet (10 mg total) by mouth daily. (Patient not taking: Reported on 11/08/2015) 30 tablet 3  . Olopatadine HCl 0.2 % SOLN Apply 1 drop to eye daily. (Patient not taking: Reported on 11/08/2015) 2.5 mL 0  . pantoprazole (PROTONIX) 40 MG tablet Take 1 tablet (40 mg total) by mouth daily. (Patient not taking: Reported on 11/08/2015) 30 tablet 3   No current facility-administered medications on file prior to visit.  No Known Allergies  Social History   Socioeconomic History  . Marital status: Single    Spouse name: Not on file  . Number of children: Not on file  . Years of education: Not on file  . Highest education level: Not on file    Occupational History  . Not on file  Tobacco Use  . Smoking status: Never Smoker  . Smokeless tobacco: Never Used  Vaping Use  . Vaping Use: Never used  Substance and Sexual Activity  . Alcohol use: No  . Drug use: No  . Sexual activity: Not Currently    Birth control/protection: None, Surgical  Other Topics Concern  . Not on file  Social History Narrative  . Not on file   Social Determinants of Health   Financial Resource Strain:   . Difficulty of Paying Living Expenses: Not on file  Food Insecurity:   . Worried About Programme researcher, broadcasting/film/video in the Last Year: Not on file  . Ran Out of Food in the Last Year: Not on file  Transportation Needs:   . Lack of Transportation (Medical): Not on file  . Lack of Transportation (Non-Medical): Not on file  Physical Activity:   . Days of Exercise per Week: Not on file  . Minutes of Exercise per Session: Not on file  Stress:   . Feeling of Stress : Not on file  Social Connections:   . Frequency of Communication with Friends and Family: Not on file  . Frequency of Social Gatherings with Friends and Family: Not on file  . Attends Religious Services: Not on file  . Active Member of Clubs or Organizations: Not on file  . Attends Banker Meetings: Not on file  . Marital Status: Not on file  Intimate Partner Violence:   . Fear of Current or Ex-Partner: Not on file  . Emotionally Abused: Not on file  . Physically Abused: Not on file  . Sexually Abused: Not on file    Family History  Problem Relation Age of Onset  . Asthma Mother   . Cancer Neg Hx   . Heart disease Neg Hx     Past Surgical History:  Procedure Laterality Date  . TUBAL LIGATION      ROS: Review of Systems Negative except as stated above  PHYSICAL EXAM: Vitals with BMI 07/10/2020 10/20/2019 09/01/2019  Height 5\' 4"  5\' 5"  5\' 5"   Weight 143 lbs 147 lbs 3 oz 148 lbs 6 oz  BMI 24.53 24.5 24.7  Systolic 121 103  Diastolic 67 67 73  Pulse 71 65 75    Wt Readings from Last 3 Encounters:  07/10/20 143 lb (64.9 kg)  10/20/19 147 lb 3.2 oz (66.8 kg)  09/01/19 148 lb 6.4 oz (67.3 kg)    Physical Exam General appearance - alert, well appearing, and in no distress and oriented to person, place, and time Mental status - alert, oriented to person, place, and time, normal mood, behavior, speech, dress, motor activity, and thought processes Neck - supple, no significant adenopathy Lymphatics - no palpable lymphadenopathy, no hepatosplenomegaly Chest - clear to auscultation, no wheezes, rales or rhonchi, symmetric air entry, no tachypnea, retractions or cyanosis Heart - normal rate, regular rhythm, normal S1, S2, no murmurs, rubs, clicks or gallops Abdomen - soft, nontender, nondistended, no masses or organomegaly Back exam - full range of motion, no tenderness, palpable spasm or pain on motion Neurological - alert, oriented, normal speech, no focal findings or movement  disorder noted, neck supple without rigidity, cranial nerves II through XII intact, motor and sensory grossly normal bilaterally, normal muscle tone, no tremors, strength 5/5, Romberg sign negative, normal gait and station Musculoskeletal - no joint tenderness, deformity or swelling, no muscular tenderness noted, full range of motion without pain Extremities - peripheral pulses normal, no pedal edema, no clubbing or cyanosis  ASSESSMENT AND PLAN: 1. Gastroesophageal reflux disease, unspecified whether esophagitis present: - Pantoprazole for GERD as prescribed.  - Follow-up with primary physician in 3 months or sooner if needed. - pantoprazole (PROTONIX) 20 MG tablet; Take 1 tablet (20 mg total) by mouth daily.  Dispense: 30 tablet; Refill: 2 - You may feel better if you:  ?Lose weight (if you are overweight)  ?Raise the head of your bed by 6 to 8 inches - You can do this by putting blocks of wood or rubber under 2 legs of the bed or a foam wedge under the mattress.  ?Avoid  foods that make your symptoms worse - For some people these include coffee, chocolate, alcohol, peppermint, and fatty foods.  ?Stop smoking, if you smoke  ?Avoid late meals - Lying down with a full stomach can make reflux worse. Try to plan meals for at least 2 to 3 hours before bedtime.  ?Avoid tight clothing - Some people feel better if they wear comfortable clothing that does not squeeze the stomach area.  2. Chronic pain of right knee: - Patient with chronic right knee pain. Denies trauma and injury. Physical examination unremarkable. - Continue Ibuprofen over-the-counter as needed for pain management, rest, ice, elevation, and as needed compression with a knee brace purchased over-the-counter. - Per patient request referral to Orthopedic Surgery for further evaluation and management. - Ambulatory referral to Orthopedic Surgery  3. Chronic bilateral low back pain, unspecified whether sciatica present: - Patient with chronic back pain. Denies trauma and injury. Physical examination unremarkable. - Continue Ibuprofen over-the-counter as needed for pain management, rest, ice, elevation, and as needed back brace which patient already has at home. - Per patient request referral to Orthopedic Surgery for further evaluation and management. - Ambulatory referral to Orthopedic Surgery - Ambulatory referral to Orthopedic Surgery  4. Encounter for dental examination: - Per patient request referral to Dentistry for annual dental examination and evaluation. - Ambulatory referral to Dentistry  Patient was given the opportunity to ask questions.  Patient verbalized understanding of the plan and was able to repeat key elements of the plan. Patient was given clear instructions to go to Emergency Department or return to medical center if symptoms don't improve, worsen, or new problems develop.The patient verbalized understanding.   Rema Fendt, NP

## 2020-07-10 NOTE — Patient Instructions (Signed)
Protonix for GERD. Referral to Orthopedics for chronic back and knee pain. Referral to Dentistry for annual examination. Follow-up with primary physician in 3 months or sooner if needed.  Gastroesophageal Reflux Disease, Adult Gastroesophageal reflux (GER) happens when acid from the stomach flows up into the tube that connects the mouth and the stomach (esophagus). Normally, food travels down the esophagus and stays in the stomach to be digested. However, when a person has GER, food and stomach acid sometimes move back up into the esophagus. If this becomes a more serious problem, the person may be diagnosed with a disease called gastroesophageal reflux disease (GERD). GERD occurs when the reflux:  Happens often.  Causes frequent or severe symptoms.  Causes problems such as damage to the esophagus. When stomach acid comes in contact with the esophagus, the acid may cause soreness (inflammation) in the esophagus. Over time, GERD may create small holes (ulcers) in the lining of the esophagus. What are the causes? This condition is caused by a problem with the muscle between the esophagus and the stomach (lower esophageal sphincter, or LES). Normally, the LES muscle closes after food passes through the esophagus to the stomach. When the LES is weakened or abnormal, it does not close properly, and that allows food and stomach acid to go back up into the esophagus. The LES can be weakened by certain dietary substances, medicines, and medical conditions, including:  Tobacco use.  Pregnancy.  Having a hiatal hernia.  Alcohol use.  Certain foods and beverages, such as coffee, chocolate, onions, and peppermint. What increases the risk? You are more likely to develop this condition if you:  Have an increased body weight.  Have a connective tissue disorder.  Use NSAID medicines. What are the signs or symptoms? Symptoms of this condition include:  Heartburn.  Difficult or painful  swallowing.  The feeling of having a lump in the throat.  Abitter taste in the mouth.  Bad breath.  Having a large amount of saliva.  Having an upset or bloated stomach.  Belching.  Chest pain. Different conditions can cause chest pain. Make sure you see your health care provider if you experience chest pain.  Shortness of breath or wheezing.  Ongoing (chronic) cough or a night-time cough.  Wearing away of tooth enamel.  Weight loss. How is this diagnosed? Your health care provider will take a medical history and perform a physical exam. To determine if you have mild or severe GERD, your health care provider may also monitor how you respond to treatment. You may also have tests, including:  A test to examine your stomach and esophagus with a small camera (endoscopy).  A test thatmeasures the acidity level in your esophagus.  A test thatmeasures how much pressure is on your esophagus.  A barium swallow or modified barium swallow test to show the shape, size, and functioning of your esophagus. How is this treated? The goal of treatment is to help relieve your symptoms and to prevent complications. Treatment for this condition may vary depending on how severe your symptoms are. Your health care provider may recommend:  Changes to your diet.  Medicine.  Surgery. Follow these instructions at home: Eating and drinking   Follow a diet as recommended by your health care provider. This may involve avoiding foods and drinks such as: ? Coffee and tea (with or without caffeine). ? Drinks that containalcohol. ? Energy drinks and sports drinks. ? Carbonated drinks or sodas. ? Chocolate and cocoa. ? Peppermint and mint  flavorings. ? Garlic and onions. ? Horseradish. ? Spicy and acidic foods, including peppers, chili powder, curry powder, vinegar, hot sauces, and barbecue sauce. ? Citrus fruit juices and citrus fruits, such as oranges, lemons, and limes. ? Tomato-based  foods, such as red sauce, chili, salsa, and pizza with red sauce. ? Fried and fatty foods, such as donuts, french fries, potato chips, and high-fat dressings. ? High-fat meats, such as hot dogs and fatty cuts of red and white meats, such as rib eye steak, sausage, ham, and bacon. ? High-fat dairy items, such as whole milk, butter, and cream cheese.  Eat small, frequent meals instead of large meals.  Avoid drinking large amounts of liquid with your meals.  Avoid eating meals during the 2-3 hours before bedtime.  Avoid lying down right after you eat.  Do not exercise right after you eat. Lifestyle   Do not use any products that contain nicotine or tobacco, such as cigarettes, e-cigarettes, and chewing tobacco. If you need help quitting, ask your health care provider.  Try to reduce your stress by using methods such as yoga or meditation. If you need help reducing stress, ask your health care provider.  If you are overweight, reduce your weight to an amount that is healthy for you. Ask your health care provider for guidance about a safe weight loss goal. General instructions  Pay attention to any changes in your symptoms.  Take over-the-counter and prescription medicines only as told by your health care provider. Do not take aspirin, ibuprofen, or other NSAIDs unless your health care provider told you to do so.  Wear loose-fitting clothing. Do not wear anything tight around your waist that causes pressure on your abdomen.  Raise (elevate) the head of your bed about 6 inches (15 cm).  Avoid bending over if this makes your symptoms worse.  Keep all follow-up visits as told by your health care provider. This is important. Contact a health care provider if:  You have: ? New symptoms. ? Unexplained weight loss. ? Difficulty swallowing or it hurts to swallow. ? Wheezing or a persistent cough. ? A hoarse voice.  Your symptoms do not improve with treatment. Get help right away if  you:  Have pain in your arms, neck, jaw, teeth, or back.  Feel sweaty, dizzy, or light-headed.  Have chest pain or shortness of breath.  Vomit and your vomit looks like blood or coffee grounds.  Faint.  Have stool that is bloody or black.  Cannot swallow, drink, or eat. Summary  Gastroesophageal reflux happens when acid from the stomach flows up into the esophagus. GERD is a disease in which the reflux happens often, causes frequent or severe symptoms, or causes problems such as damage to the esophagus.  Treatment for this condition may vary depending on how severe your symptoms are. Your health care provider may recommend diet and lifestyle changes, medicine, or surgery.  Contact a health care provider if you have new or worsening symptoms.  Take over-the-counter and prescription medicines only as told by your health care provider. Do not take aspirin, ibuprofen, or other NSAIDs unless your health care provider told you to do so.  Keep all follow-up visits as told by your health care provider. This is important. This information is not intended to replace advice given to you by your health care provider. Make sure you discuss any questions you have with your health care provider. Document Revised: 04/07/2018 Document Reviewed: 04/07/2018 Elsevier Patient Education  2020 [employer].

## 2020-07-13 ENCOUNTER — Other Ambulatory Visit: Payer: Self-pay | Admitting: Family Medicine

## 2020-07-13 ENCOUNTER — Encounter: Payer: Self-pay | Admitting: Family Medicine

## 2020-07-13 ENCOUNTER — Ambulatory Visit (INDEPENDENT_AMBULATORY_CARE_PROVIDER_SITE_OTHER): Payer: Self-pay | Admitting: Family Medicine

## 2020-07-13 ENCOUNTER — Other Ambulatory Visit: Payer: Self-pay

## 2020-07-13 ENCOUNTER — Telehealth: Payer: Self-pay | Admitting: Family Medicine

## 2020-07-13 DIAGNOSIS — M545 Low back pain, unspecified: Secondary | ICD-10-CM

## 2020-07-13 DIAGNOSIS — G8929 Other chronic pain: Secondary | ICD-10-CM

## 2020-07-13 DIAGNOSIS — M546 Pain in thoracic spine: Secondary | ICD-10-CM

## 2020-07-13 DIAGNOSIS — M25561 Pain in right knee: Secondary | ICD-10-CM

## 2020-07-13 MED ORDER — MELOXICAM 15 MG PO TABS: 7.5000 mg | ORAL_TABLET | Freq: Every day | ORAL | 6 refills | Status: DC | PRN

## 2020-07-13 MED FILL — MELOXICAM 15 MG TABLET: 15 | 30 days supply | Qty: 30 | Fill #0

## 2020-07-13 NOTE — Progress Notes (Signed)
Office Visit Note   Patient: Heidi Ellis           Date of Birth: Dec 13, 1975           MRN: [id number] Visit Date: 07/13/2020 Requested by: Rema Fendt, NP [address] [place],  [place] [address] PCP: Cain Saupe, MD  Subjective: Chief Complaint  Patient presents with  . Middle Back - Pain    Pain down middle of back and across lower back, intermittent x 6 years. 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 Pain btw shoulder blades disturbs sleep. Some radiating down left buttock.   . Lower Back - Pain  . Right Knee - Pain    Intermittent pain x 6 years. Occasional swelling. Occasional pain to touch, medial aspect of knee.     HPI: She is here with low back and right knee pain.  Intermittent pain for the past 6 years.  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  She cannot recall a specific injury.  She gets pain in the mid thoracic area, down in the lower back and the left posterior hip, and also pain on the medial side of her right knee.  She has not taken medications for her pain.  She has a history of vitamin D deficiency.  Recently she started taking vitamin D and some other vitamins, and she is feeling better overall.               ROS: No incontinence.  No fevers or chills.  All other systems were reviewed and are negative.  Objective: Vital Signs: LMP 06/17/2020 (Exact Date)   Physical Exam:  General:  Alert and oriented, in no acute distress. Pulm:  Breathing unlabored. Psy:  Normal mood, congruent affect. Skin: No visible rash. Back: She has no thoracolumbar scoliosis.  She has some tenderness along the lower thoracic spinous processes.  No pain over the SI joints, mild tenderness in the left sciatic notch.  Lower extremity strength and reflexes are normal.  Straight leg raise is negative. Right knee: Trace patellofemoral crepitus, no effusion.  Lachman's feels solid, negative patella compression  test.  Mild tenderness on the medial joint line, she has a palpable click with with McMurray's but no pain.  Imaging: None today  Assessment & Plan: 1.  Thoracolumbar back pain, could be myofascial. -We will try meloxicam as needed.  Home exercises given for core strengthening.  Physical therapy if symptoms persist.  X-rays and MRI scan of she fails to improve.  2.  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  Straight leg raises for strengthening.  X-rays if symptoms persist.      Procedures: No procedures performed  No notes on file     PMFS History: Patient Active Problem List   Diagnosis Date Noted  . Encounter for annual physical exam 11/08/2015  . Pap smear for cervical cancer screening 11/08/2015  . Allergic rhinitis 01/19/2014  . Dental caries 01/19/2014  . Dysmenorrhea 05/04/2013  . Menorrhagia 05/04/2013   Past Medical History:  Diagnosis Date  . Allergic rhinitis     Family History  Problem Relation Age of Onset  . Asthma Mother   . Cancer Neg Hx   . Heart disease Neg Hx     Past Surgical History:  Procedure Laterality Date  . TUBAL LIGATION     Social History   Occupational History  .  Not on file  Tobacco Use  . Smoking status: Never Smoker  . Smokeless tobacco: Never Used  Vaping Use  . Vaping Use: Never used  Substance and Sexual Activity  . Alcohol use: No  . Drug use: No  . Sexual activity: Not Currently    Birth control/protection: None, Surgical

## 2020-07-13 NOTE — Telephone Encounter (Signed)
Patient is coming in on Monday 10/4 to have labs done and would like for the provider to add an order to have her iron checked.

## 2020-07-13 NOTE — Patient Instructions (Addendum)
    Try Glucosamine Sulfate:  1,000 mg twice daily   Can use meloxicam if having a lot of pain, but try not to use it every day.

## 2020-07-16 ENCOUNTER — Other Ambulatory Visit: Payer: Self-pay

## 2020-07-16 ENCOUNTER — Ambulatory Visit: Payer: Self-pay | Attending: Family Medicine

## 2020-07-16 DIAGNOSIS — Z0000 Encounter for general adult medical examination without abnormal findings: Secondary | ICD-10-CM

## 2020-07-16 DIAGNOSIS — Z13220 Encounter for screening for lipoid disorders: Secondary | ICD-10-CM

## 2020-07-17 LAB — BASIC METABOLIC PANEL WITH GFR
BUN/Creatinine Ratio: 16 (ref 9–23)
BUN: 11 mg/dL (ref 6–24)
CO2: 24 mmol/L (ref 20–29)
Calcium: 10.2 mg/dL (ref 8.7–10.2)
Chloride: 104 mmol/L (ref 96–106)
Creatinine, Ser: 0.7 mg/dL (ref 0.57–1.00)
GFR calc Af Amer: 123 mL/min/1.73
GFR calc non Af Amer: 106 mL/min/1.73
Glucose: 98 mg/dL (ref 65–99)
Potassium: 4.9 mmol/L (ref 3.5–5.2)
Sodium: 140 mmol/L (ref 134–144)

## 2020-07-17 LAB — LIPID PANEL
Chol/HDL Ratio: 3.7 ratio (ref 0.0–4.4)
Cholesterol, Total: 211 mg/dL — ABNORMAL HIGH (ref 100–199)
HDL: 57 mg/dL
LDL Chol Calc (NIH): 138 mg/dL — ABNORMAL HIGH (ref 0–99)
Triglycerides: 92 mg/dL (ref 0–149)
VLDL Cholesterol Cal: 16 mg/dL (ref 5–40)

## 2020-11-14 ENCOUNTER — Other Ambulatory Visit: Payer: Self-pay | Admitting: Physician Assistant

## 2020-11-14 ENCOUNTER — Ambulatory Visit: Payer: Self-pay | Attending: Physician Assistant | Admitting: Physician Assistant

## 2020-11-14 ENCOUNTER — Other Ambulatory Visit: Payer: Self-pay

## 2020-11-14 DIAGNOSIS — H6980 Other specified disorders of Eustachian tube, unspecified ear: Secondary | ICD-10-CM

## 2020-11-14 MED ORDER — FLUTICASONE PROPIONATE 50 MCG/ACT NA SUSP: 2.0000 | Freq: Every day | NASAL | 6 refills | Status: DC

## 2020-11-14 MED ORDER — PREDNISONE 10 MG PO TABS: ORAL_TABLET | ORAL | 0 refills | Status: DC

## 2020-11-14 MED FILL — FLUTICASONE PROP 50 MCG SPR: 50 | 30 days supply | Qty: 16 | Fill #0

## 2020-11-14 MED FILL — predniSONE 10 MG TABS: 10 | 6 days supply | Qty: 21 | Fill #0

## 2020-11-14 NOTE — Progress Notes (Signed)
Virtual Visit via Telephone Note  I connected with Heidi Ellis on 11/14/20 at  9:10 AM EST by telephone and verified that I am speaking with the correct person using two identifiers.  Location: Patient: home Provider: [facility] office   I discussed the limitations, risks, security and privacy concerns of performing an evaluation and management service by telephone and the availability of in person appointments. I also discussed with the patient that there may be a patient responsible charge related to this service. The patient expressed understanding and agreed to proceed.   History of Present Illness: R ear is congested for 2 months after a cold and it is still congested and sometimes there is a high pitched noise in her ear.  No fever.  No pain.  No URI s/sx currently.  Not taking anything for it.  Not diabetic.      Observations/Objective: NAD.  A&Ox3  Assessment and Plan: 1. Dysfunction of Eustachian tube, unspecified laterality -advised to get sudafed or phenylephrine.  If these measures are successful, she can cancel appt with ENT.   - Ambulatory referral to ENT - predniSONE (DELTASONE) 10 MG tablet; 6,5,4,3,2,1 take each days dose in am with food  Dispense: 21 tablet; Refill: 0 - fluticasone (FLONASE) 50 MCG/ACT nasal spray; Place 2 sprays into both nostrils daily.  Dispense: 16 g; Refill: 6    Follow Up Instructions: 3 months assign new PCP   I discussed the assessment and treatment plan with the patient. The patient was provided an opportunity to ask questions and all were answered. The patient agreed with the plan and demonstrated an understanding of the instructions.   The patient was advised to call back or seek an in-person evaluation if the symptoms worsen or if the condition fails to improve as anticipated.  I provided 13 minutes of non-face-to-face time during this encounter.   Georgian Co, PA-C  Patient ID: Heidi Ellis, female   DOB: 1976/01/26, 45  y.o.   MRN: [id number]

## 2020-12-07 ENCOUNTER — Telehealth: Payer: Self-pay

## 2020-12-07 NOTE — Telephone Encounter (Signed)
I return Pt call, she was explain the financial appt will be only for [facility]

## 2020-12-07 NOTE — Telephone Encounter (Signed)
Copied from CRM [id number]. Topic: General - Other >> Dec 06, 2020  4:55 PM Pawlus, Maxine Glenn A wrote: Reason for CRM: Pt wanted to speak with Mikle Bosworth, had some questions about renewing her [employer].

## 2020-12-12 ENCOUNTER — Ambulatory Visit: Payer: Self-pay | Attending: Family Medicine

## 2020-12-12 ENCOUNTER — Other Ambulatory Visit: Payer: Self-pay

## 2021-01-16 ENCOUNTER — Ambulatory Visit: Payer: Self-pay | Admitting: Family Medicine

## 2021-09-24 ENCOUNTER — Telehealth: Payer: Self-pay

## 2021-09-24 NOTE — Telephone Encounter (Signed)
I return Pt call, LVM to call back 

## 2021-09-24 NOTE — Telephone Encounter (Signed)
Copied from CRM [id number]. Topic: Appointment Scheduling - Scheduling Inquiry for Clinic >> Sep 19, 2021 12:46 PM Daphine Deutscher D wrote: Pt needs an appt to renew her [employer] card.     CB#  [phone]

## 2022-05-22 ENCOUNTER — Ambulatory Visit: Payer: Self-pay | Admitting: Critical Care Medicine

## 2022-05-22 NOTE — Progress Notes (Deleted)
   New Patient Office Visit  Subjective    Patient ID: Heidi Ellis, female    DOB: December 09, 1975  Age: 46 y.o. MRN: [id number]  CC: No chief complaint on file.   HPI [name] presents to establish care   Outpatient Encounter Medications as of 05/22/2022  Medication Sig   cyclobenzaprine (FLEXERIL) 5 MG tablet Take 1 tablet (5 mg total) by mouth 3 (three) times daily as needed for muscle spasms. In neck area/headaches (Patient not taking: Reported on 07/13/2020)   fluticasone (FLONASE) 50 MCG/ACT nasal spray PLACE 2 SPRAYS INTO BOTH NOSTRILS DAILY.   ibuprofen (ADVIL,MOTRIN) 800 MG tablet Take 1 tablet (800 mg total) by mouth every 8 (eight) hours as needed.   pantoprazole (PROTONIX) 20 MG tablet Take 1 tablet (20 mg total) by mouth daily.   No facility-administered encounter medications on file as of 05/22/2022.    Past Medical History:  Diagnosis Date   Allergic rhinitis     Past Surgical History:  Procedure Laterality Date   TUBAL LIGATION      Family History  Problem Relation Age of Onset   Asthma Mother    Cancer Neg Hx    Heart disease Neg Hx     Social History   Socioeconomic History   Marital status: Single    Spouse name: Not on file   Number of children: Not on file   Years of education: Not on file   Highest education level: Not on file  Occupational History   Not on file  Tobacco Use   Smoking status: Never   Smokeless tobacco: Never  Vaping Use   Vaping Use: Never used  Substance and Sexual Activity   Alcohol use: No   Drug use: No   Sexual activity: Not Currently    Birth control/protection: None, Surgical  Other Topics Concern   Not on file  Social History Narrative   Not on file   Social Determinants of Health   Financial Resource Strain: Not on file  Food Insecurity: Not on file  Transportation Needs: Not on file  Physical Activity: Not on file  Stress: Not on file  Social Connections: Not on file  Intimate Partner  Violence: Not on file    ROS      Objective    There were no vitals taken for this visit.  Physical Exam  {Labs (Optional):23779}    Assessment & Plan:   Problem List Items Addressed This Visit   None   No follow-ups on file.   Shan Levans, MD

## 2022-07-17 ENCOUNTER — Ambulatory Visit: Payer: Self-pay | Attending: Family Medicine | Admitting: Critical Care Medicine

## 2022-07-17 ENCOUNTER — Other Ambulatory Visit: Payer: Self-pay

## 2022-07-17 ENCOUNTER — Encounter: Payer: Self-pay | Admitting: Critical Care Medicine

## 2022-07-17 VITALS — BP 116/75 | HR 71 | Ht 64.0 in | Wt 145.6 lb

## 2022-07-17 DIAGNOSIS — N946 Dysmenorrhea, unspecified: Secondary | ICD-10-CM

## 2022-07-17 DIAGNOSIS — M5416 Radiculopathy, lumbar region: Secondary | ICD-10-CM

## 2022-07-17 DIAGNOSIS — H6120 Impacted cerumen, unspecified ear: Secondary | ICD-10-CM | POA: Insufficient documentation

## 2022-07-17 DIAGNOSIS — G44209 Tension-type headache, unspecified, not intractable: Secondary | ICD-10-CM

## 2022-07-17 DIAGNOSIS — Z124 Encounter for screening for malignant neoplasm of cervix: Secondary | ICD-10-CM

## 2022-07-17 DIAGNOSIS — Z139 Encounter for screening, unspecified: Secondary | ICD-10-CM | POA: Insufficient documentation

## 2022-07-17 DIAGNOSIS — H6121 Impacted cerumen, right ear: Secondary | ICD-10-CM

## 2022-07-17 DIAGNOSIS — Z1211 Encounter for screening for malignant neoplasm of colon: Secondary | ICD-10-CM

## 2022-07-17 HISTORY — DX: Impacted cerumen, unspecified ear: H61.20

## 2022-07-17 MED ORDER — DEBROX 6.5 % OT SOLN
5.0000 [drp] | Freq: Two times a day (BID) | OTIC | 0 refills | Status: DC
  Filled 2022-07-17: qty 15, 30d supply, fill #0

## 2022-07-17 MED ORDER — CYCLOBENZAPRINE HCL 5 MG PO TABS
5.0000 mg | ORAL_TABLET | Freq: Three times a day (TID) | ORAL | 1 refills | Status: DC | PRN
  Filled 2022-07-17: qty 60, 20d supply, fill #0

## 2022-07-17 MED ORDER — NORETHINDRONE ACETATE 5 MG PO TABS
5.0000 mg | ORAL_TABLET | Freq: Every day | ORAL | 6 refills | Status: DC
  Filled 2022-07-17: qty 30, 30d supply, fill #0
  Filled 2022-08-22: qty 30, 30d supply, fill #1
  Filled 2022-09-26: qty 30, 30d supply, fill #2
  Filled 2022-10-30: qty 30, 30d supply, fill #3
  Filled 2022-11-27 – 2022-11-28 (×2): qty 30, 30d supply, fill #4
  Filled 2022-12-30: qty 30, 30d supply, fill #5
  Filled 2023-01-28: qty 30, 30d supply, fill #6

## 2022-07-17 MED ORDER — OLOPATADINE HCL 0.1 % OP SOLN
1.0000 [drp] | Freq: Two times a day (BID) | OPHTHALMIC | 12 refills | Status: DC
  Filled 2022-07-17: qty 5, 25d supply, fill #0

## 2022-07-17 MED ORDER — DICLOFENAC SODIUM 1 % EX GEL
2.0000 g | Freq: Four times a day (QID) | CUTANEOUS | 2 refills | Status: DC
  Filled 2022-07-17: qty 100, 13d supply, fill #0
  Filled 2022-11-27 – 2022-11-28 (×2): qty 100, 13d supply, fill #1

## 2022-07-17 NOTE — Assessment & Plan Note (Signed)
History of dysmenorrhea in the past plan to begin norethindrone and if not improved send to gynecology  Next Pap smear due in January 2024 we will order

## 2022-07-17 NOTE — Assessment & Plan Note (Signed)
Lumbar radiculopathy on exam plan refill on cyclobenzaprine and obtain Voltaren gel for topical application Plan referral to orthopedic spine Patient does have the orange card

## 2022-07-17 NOTE — Assessment & Plan Note (Signed)
Complete screening labs obtained at this visit

## 2022-07-17 NOTE — Assessment & Plan Note (Signed)
Have ordered fecal occult kit

## 2022-07-17 NOTE — Assessment & Plan Note (Signed)
Screening negative previously next Pap smear due 2024 we will order

## 2022-07-17 NOTE — Patient Instructions (Addendum)
Perform the exercises as attached  Apply Voltaren gel to both knees 4 times daily as needed for knee pain can also use Tylenol extra strength 1-2 4 times daily as needed for pain  You can apply Voltaren gel to the lower back 4 times daily as needed for pain and we also gave you refills on cyclobenzaprine to take 3 times daily as needed for back spasm  Referral back to orthopedics was made for your back  Please process your application as soon as possible for financial assistance  Labs today include health screening labs and a kit to screen for colon cancer  An appointment for January be made for another Pap smear  Eyedrops were given for inflammation in the eyes which are due to allergies  Begin birth control medication 1 pill daily to control menstrual pain if this is not successful we can send you to gynecology for referral  Your right ear was washed out with saline and we have applied medication called Debrox in the ear we will give you a prescription for this to use at home as well to reduce the wax in the right ear  Return to see Dr. Joya Gaskins 3 months  Aplique gel Voltaren en ambas rodillas 4 veces al da segn sea necesario para el dolor de rodilla. Tambin puede usar Tylenol extra fuerte 1-2 4 veces al da segn sea necesario para Conservation officer, historic buildings.  Puede aplicar el gel Voltaren en la parte baja de la espalda 4 veces al da segn sea necesario para el dolor y tambin le proporcionamos reabastecimientos de ciclobenzaprina para tomar 3 veces al da segn sea necesario para los espasmos de espalda.  Se realiz remisin nuevamente a ortopedia para su espalda.  Procese su solicitud lo antes posible para recibir asistencia financiera.  Los laboratorios de hoy incluyen laboratorios de deteccin de salud y un kit para Film/video editor de colon.  Se concertar cita para enero para otra prueba de Papanicolaou  Se administraron gotas para los ojos para la inflamacin de los ojos debido a  Set designer.  Comenzar medicacin anticonceptiva 1 pastilla diaria para controlar el dolor menstrual, si esto no tiene xito podemos enviarla a ginecologa para derivacin.  Le lavaron el odo derecho con solucin salina y le aplicamos un medicamento llamado Debrox en el odo. Le daremos una receta para que lo use en casa y para reducir la cera en el odo derecho.  Volver a ver al Dr. Joya Gaskins 3 meses

## 2022-07-17 NOTE — Assessment & Plan Note (Signed)
Cerumen impaction right ear this was cleaned with Debrox and lavage  Minimal wax left in the ear  Prescription for Debrox given to patient to use at home

## 2022-07-17 NOTE — Progress Notes (Signed)
New Patient Office Visit  Subjective    Patient ID: Heidi Ellis, female    DOB: 09-Sep-1976  Age: 46 y.o. MRN: [id number]  CC:  Chief Complaint  Patient presents with   Back Pain    HPI Heidi Ellis presents to establish care This is a pleasant 46 year old female who is a former primary care patient of Dr. Chapman Fitch now here to reestablish care last seen in this clinic in February 2022.  Visit was assisted by Spanish interpreter Vonna Kotyk [phone] on [facility] interpreter audio.  Patient has multiple complaints.  #1 complains of right knee pain which is chronic #2 chronic low back pain which she has been seen by sports medicine in the past also has scoliosis and thoracic spine #3 right eye itching and irritation and vision change #4 decreased hearing in the right ear with ear pain #5 menstrual cycle dysmenorrhea and excess bleeding.  Patient also needs health screenings and colon cancer screenings.  Patient declines flu vaccine.  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  No other chronic health history seen she has had both of her tubes ligated    Outpatient Encounter Medications as of 07/17/2022  Medication Sig   carbamide peroxide (DEBROX) 6.5 % OTIC solution Place 5 drops into the right ear 2 (two) times daily.   diclofenac Sodium (VOLTAREN) 1 % GEL Apply 2 g topically 4 (four) times daily.   norethindrone (AYGESTIN) 5 MG tablet Take 1 tablet (5 mg total) by mouth daily.   olopatadine (PATANOL) 0.1 % ophthalmic solution Place 1 drop into both eyes 2 (two) times daily.   cyclobenzaprine (FLEXERIL) 5 MG tablet Take 1 tablet (5 mg total) by mouth 3 (three) times daily as needed for muscle spasms in neck area/headaches.   [DISCONTINUED] cyclobenzaprine (FLEXERIL) 5 MG tablet Take 1 tablet (5 mg total) by mouth 3 (three) times daily as needed for muscle spasms. In neck  area/headaches (Patient not taking: Reported on 07/13/2020)   [DISCONTINUED] fluticasone (FLONASE) 50 MCG/ACT nasal spray PLACE 2 SPRAYS INTO BOTH NOSTRILS DAILY.   [DISCONTINUED] ibuprofen (ADVIL,MOTRIN) 800 MG tablet Take 1 tablet (800 mg total) by mouth every 8 (eight) hours as needed. (Patient not taking: Reported on 07/17/2022)   [DISCONTINUED] pantoprazole (PROTONIX) 20 MG tablet Take 1 tablet (20 mg total) by mouth daily.   No facility-administered encounter medications on file as of 07/17/2022.    Past Medical History:  Diagnosis Date   Allergic rhinitis     Past Surgical History:  Procedure Laterality Date   TUBAL LIGATION      Family History  Problem Relation Age of Onset   Asthma Mother    Cancer Neg Hx    Heart disease Neg Hx     Social History   Socioeconomic History   Marital status: Single    Spouse name: Not on file   Number of children: Not on file   Years of education: Not on file   Highest education level: Not on file  Occupational History   Not on file  Tobacco Use   Smoking status: Never   Smokeless tobacco: Never  Vaping Use   Vaping Use: Never used  Substance and Sexual Activity   Alcohol use: No   Drug use: No   Sexual activity: Not Currently    Birth control/protection: None, Surgical  Other Topics Concern   Not on file  Social History Narrative  Not on file   Social Determinants of Health   Financial Resource Strain: Not on file  Food Insecurity: Not on file  Transportation Needs: Not on file  Physical Activity: Not on file  Stress: Not on file  Social Connections: Not on file  Intimate Partner Violence: Not on file    Review of Systems  Constitutional:  Negative for chills, diaphoresis, fever, malaise/fatigue and weight loss.  HENT:  Positive for ear pain. Negative for congestion, hearing loss, nosebleeds, sore throat and tinnitus.   Eyes:  Positive for blurred vision. Negative for photophobia and redness.       Eye itching   Respiratory:  Negative for cough, hemoptysis, sputum production, shortness of breath, wheezing and stridor.   Cardiovascular:  Negative for chest pain, palpitations, orthopnea, claudication, leg swelling and PND.  Gastrointestinal:  Negative for abdominal pain, blood in stool, constipation, diarrhea, heartburn, nausea and vomiting.  Genitourinary:  Negative for dysuria, flank pain, frequency, hematuria and urgency.  Musculoskeletal:  Positive for back pain. Negative for falls, joint pain, myalgias and neck pain.  Skin:  Negative for itching and rash.  Neurological:  Negative for dizziness, tingling, tremors, sensory change, speech change, focal weakness, seizures, loss of consciousness, weakness and headaches.  Endo/Heme/Allergies:  Negative for environmental allergies and polydipsia. Does not bruise/bleed easily.  Psychiatric/Behavioral:  Negative for depression, memory loss, substance abuse and suicidal ideas. The patient is not nervous/anxious and does not have insomnia.         Objective    BP 116/75   Pulse 71   Ht 5' 4"  (1.626 m)   Wt 145 lb 9.6 oz (66 kg)   LMP 07/01/2022 (Exact Date)   SpO2 100%   BMI 24.99 kg/m   Physical Exam Vitals reviewed.  Constitutional:      Appearance: Normal appearance. She is well-developed. She is not diaphoretic.  HENT:     Head: Normocephalic and atraumatic.     Right Ear: External ear normal. There is impacted cerumen.     Left Ear: Tympanic membrane, ear canal and external ear normal. There is no impacted cerumen.     Nose: No nasal deformity, septal deviation, mucosal edema, congestion or rhinorrhea.     Right Sinus: No maxillary sinus tenderness or frontal sinus tenderness.     Left Sinus: No maxillary sinus tenderness or frontal sinus tenderness.     Mouth/Throat:     Mouth: Mucous membranes are moist.     Pharynx: Oropharynx is clear. No oropharyngeal exudate.  Eyes:     General: No scleral icterus.       Right eye: No discharge.         Left eye: No discharge.     Extraocular Movements: Extraocular movements intact.     Pupils: Pupils are equal, round, and reactive to light.     Comments: Evidence of allergic conjunctivitis right greater than left eye  Neck:     Thyroid: No thyromegaly.     Vascular: No carotid bruit or JVD.     Trachea: Trachea normal. No tracheal tenderness or tracheal deviation.  Cardiovascular:     Rate and Rhythm: Normal rate and regular rhythm.     Chest Wall: PMI is not displaced.     Pulses: Normal pulses. No decreased pulses.     Heart sounds: Normal heart sounds, S1 normal and S2 normal. Heart sounds not distant. No murmur heard.    No systolic murmur is present.     No diastolic  murmur is present.     No friction rub. No gallop. No S3 or S4 sounds.  Pulmonary:     Effort: No tachypnea, accessory muscle usage or respiratory distress.     Breath sounds: No stridor. No decreased breath sounds, wheezing, rhonchi or rales.  Chest:     Chest wall: No tenderness.  Abdominal:     General: Bowel sounds are normal. There is no distension.     Palpations: Abdomen is soft. Abdomen is not rigid.     Tenderness: There is no abdominal tenderness. There is no guarding or rebound.  Musculoskeletal:        General: Tenderness present. Normal range of motion.     Cervical back: Normal range of motion and neck supple. No edema, erythema or rigidity. No muscular tenderness. Normal range of motion.     Comments: Evidence of bilateral chondromalacia of the patella both knees  Lymphadenopathy:     Head:     Right side of head: No submental or submandibular adenopathy.     Left side of head: No submental or submandibular adenopathy.     Cervical: No cervical adenopathy.  Skin:    General: Skin is warm and dry.     Coloration: Skin is not pale.     Findings: No rash.     Nails: There is no clubbing.  Neurological:     General: No focal deficit present.     Mental Status: She is alert and oriented  to person, place, and time.     Sensory: No sensory deficit.  Psychiatric:        Mood and Affect: Mood normal.        Speech: Speech normal.        Behavior: Behavior normal.        Thought Content: Thought content normal.        Judgment: Judgment normal.    Right ear cleaned with Debrox and lavage per nursing     Assessment & Plan:   Problem List Items Addressed This Visit       Nervous and Auditory   Lumbar radiculopathy - Primary    Lumbar radiculopathy on exam plan refill on cyclobenzaprine and obtain Voltaren gel for topical application Plan referral to orthopedic spine Patient does have the orange card      Relevant Medications   cyclobenzaprine (FLEXERIL) 5 MG tablet   Other Relevant Orders   Ambulatory referral to Orthopedic Surgery   Cerumen impaction    Cerumen impaction right ear this was cleaned with Debrox and lavage  Minimal wax left in the ear  Prescription for Debrox given to patient to use at home      Relevant Orders   Ear Lavage     Genitourinary   Dysmenorrhea    History of dysmenorrhea in the past plan to begin norethindrone and if not improved send to gynecology  Next Pap smear due in January 2024 we will order        Other   Pap smear for cervical cancer screening    Screening negative previously next Pap smear due 2024 we will order      Encounter for health-related screening    Complete screening labs obtained at this visit      Relevant Orders   Comprehensive metabolic panel   CBC with Differential/Platelet   Hemoglobin A1c   Colon cancer screening    Have ordered fecal occult kit      Relevant Orders  Fecal occult blood, imunochemical   Other Visit Diagnoses     Tension headache       Relevant Medications   cyclobenzaprine (FLEXERIL) 5 MG tablet      45 minutes spent extra time needed for lavaging the ear multiple problems assessed language barrier Return in about 3 months (around 10/17/2022) for chronic  conditions, chronic pain, back pain.   Asencion Noble, MD

## 2022-07-18 ENCOUNTER — Other Ambulatory Visit: Payer: Self-pay

## 2022-07-18 ENCOUNTER — Telehealth: Payer: Self-pay

## 2022-07-18 LAB — CBC WITH DIFFERENTIAL/PLATELET
Basophils Absolute: 0.1 10*3/uL (ref 0.0–0.2)
Basos: 2 %
EOS (ABSOLUTE): 0.1 10*3/uL (ref 0.0–0.4)
Eos: 2 %
Hematocrit: 38.8 % (ref 34.0–46.6)
Hemoglobin: 11.8 g/dL (ref 11.1–15.9)
Immature Grans (Abs): 0 10*3/uL (ref 0.0–0.1)
Immature Granulocytes: 0 %
Lymphocytes Absolute: 1.8 10*3/uL (ref 0.7–3.1)
Lymphs: 25 %
MCH: 24.1 pg — ABNORMAL LOW (ref 26.6–33.0)
MCHC: 30.4 g/dL — ABNORMAL LOW (ref 31.5–35.7)
MCV: 79 fL (ref 79–97)
Monocytes Absolute: 0.6 10*3/uL (ref 0.1–0.9)
Monocytes: 8 %
Neutrophils Absolute: 4.6 10*3/uL (ref 1.4–7.0)
Neutrophils: 63 %
Platelets: 305 10*3/uL (ref 150–450)
RBC: 4.9 x10E6/uL (ref 3.77–5.28)
RDW: 18.9 % — ABNORMAL HIGH (ref 11.7–15.4)
WBC: 7.2 10*3/uL (ref 3.4–10.8)

## 2022-07-18 LAB — COMPREHENSIVE METABOLIC PANEL
ALT: 13 IU/L (ref 0–32)
AST: 16 IU/L (ref 0–40)
Albumin/Globulin Ratio: 1.6 (ref 1.2–2.2)
Albumin: 4.6 g/dL (ref 3.9–4.9)
Alkaline Phosphatase: 58 IU/L (ref 44–121)
BUN/Creatinine Ratio: 19 (ref 9–23)
BUN: 15 mg/dL (ref 6–24)
Bilirubin Total: 0.3 mg/dL (ref 0.0–1.2)
CO2: 23 mmol/L (ref 20–29)
Calcium: 9.9 mg/dL (ref 8.7–10.2)
Chloride: 100 mmol/L (ref 96–106)
Creatinine, Ser: 0.81 mg/dL (ref 0.57–1.00)
Globulin, Total: 2.9 g/dL (ref 1.5–4.5)
Glucose: 92 mg/dL (ref 70–99)
Potassium: 4.4 mmol/L (ref 3.5–5.2)
Sodium: 137 mmol/L (ref 134–144)
Total Protein: 7.5 g/dL (ref 6.0–8.5)
eGFR: 91 mL/min/{1.73_m2} (ref >59)

## 2022-07-18 LAB — HEMOGLOBIN A1C
Est. average glucose Bld gHb Est-mCnc: 114 mg/dL
Hgb A1c MFr Bld: 5.6 % (ref 4.8–5.6)

## 2022-07-18 NOTE — Progress Notes (Signed)
Let pt know all labs are normal. No diabetes

## 2022-07-18 NOTE — Telephone Encounter (Signed)
-----   Message from Elsie Stain, MD sent at 07/18/2022  3:56 PM EDT ----- Let pt know all labs are normal. No diabetes

## 2022-07-18 NOTE — Telephone Encounter (Signed)
Pt was called and vm was left, Information has been sent to nurse pool.   

## 2022-07-22 ENCOUNTER — Other Ambulatory Visit: Payer: Self-pay

## 2022-08-22 ENCOUNTER — Other Ambulatory Visit: Payer: Self-pay

## 2022-08-28 ENCOUNTER — Other Ambulatory Visit: Payer: Self-pay

## 2022-09-26 ENCOUNTER — Other Ambulatory Visit: Payer: Self-pay

## 2022-10-22 ENCOUNTER — Ambulatory Visit: Payer: Self-pay | Admitting: Critical Care Medicine

## 2022-10-22 ENCOUNTER — Other Ambulatory Visit (HOSPITAL_COMMUNITY)
Admission: RE | Admit: 2022-10-22 | Discharge: 2022-10-22 | Disposition: A | Discharge status: Home / Self Care | Payer: Self-pay | Source: Ambulatory Visit | Attending: Physician Assistant | Admitting: Physician Assistant

## 2022-10-22 ENCOUNTER — Ambulatory Visit: Payer: Self-pay | Attending: Physician Assistant | Admitting: Physician Assistant

## 2022-10-22 ENCOUNTER — Encounter: Payer: Self-pay | Admitting: Physician Assistant

## 2022-10-22 VITALS — BP 116/76 | HR 85 | Temp 97.7°F | Ht 64.0 in | Wt 147.0 lb

## 2022-10-22 DIAGNOSIS — Z113 Encounter for screening for infections with a predominantly sexual mode of transmission: Secondary | ICD-10-CM

## 2022-10-22 DIAGNOSIS — B09 Unspecified viral infection characterized by skin and mucous membrane lesions: Secondary | ICD-10-CM

## 2022-10-22 DIAGNOSIS — R21 Rash and other nonspecific skin eruption: Secondary | ICD-10-CM

## 2022-10-22 DIAGNOSIS — Z124 Encounter for screening for malignant neoplasm of cervix: Secondary | ICD-10-CM

## 2022-10-22 NOTE — Patient Instructions (Signed)
Erupcin cutnea en los adultos Rash, Adult Una erupcin es un cambio en el color de la piel. Una erupcin tambin puede cambiar la forma en que se siente la piel. Hay muchas afecciones y [employer] que pueden causar una erupcin. Algunas erupciones pueden desaparecer despus de algunos das, pero otras pueden durar [employer]. Entre las causas frecuentes de erupciones se incluyen las siguientes: Infecciones virales como: Resfros. Sarampin. Enfermedad mano-pie-boca. Infecciones bacterianas, como: Escarlatina. Imptigo. Infecciones por hongos, como Candida. Reacciones alrgicas a los alimentos, medicamentos o productos para el cuidado de la piel. Siga estas indicaciones en su casa: El objetivo del tratamiento es calmar la picazn y evitar que la erupcin se propague. Est atento a cualquier cambio en los sntomas. Estas indicaciones pueden ayudarlo con la afeccin: [place] o aplquese los medicamentos de venta libre y los recetados solamente como se lo haya indicado el mdico. Pueden incluir: Cremas con corticoesteroides para tratar la piel enrojecida o hinchada. Lociones para Barrister's clerk. Antialrgicos por va oral (antihistamnicos). Corticoesteroides por va oral para los sntomas graves.  Cuidado de la piel Aplique compresas fras en las zonas afectadas. No se rasque ni se refriegue la piel. Evite cubrir la erupcin. Asegrese de que la erupcin est expuesta al aire todo lo posible. Control de la picazn y las [employer] baos de inmersin y las duchas calientes ya que pueden empeorar la picazn. Ardelia Mems ducha fra puede Runner, broadcasting/film/video. Trate de tomar un bao con lo siguiente: Sales de [place]. Siga las indicaciones del fabricante que se encuentran en el envase. Puede conseguirlas en la tienda de comestibles o la farmacia local. Bicarbonato de sodio. Vierta un poco en la baera como se lo haya indicado el mdico. Avena coloidal. Siga las  indicaciones del fabricante que se encuentran en el envase. Puede conseguirla en la tienda de comestibles o la farmacia local. Intente colocarse una pasta de bicarbonato de sodio sobre la piel. Agregue agua al bicarbonato hasta que tenga la consistencia de una pasta. Pruebe aplicarse locin de calamina. Se trata de una locin de venta libre que ayuda a Barrister's clerk. Mantngase fresco y al resguardo del sol. La transpiracin y el calor pueden empeorar la picazn. Indicaciones generales  Descanse todo lo que sea necesario. Beba suficiente lquido como para Theatre manager la orina de color amarillo plido. Use ropa holgada. Evite los detergentes y los jabones perfumados, y los perfumes. Utilice jabones, detergentes, perfumes y cosmticos suaves. Evite las sustancias que causan la erupcin. Lleve un diario como ayuda para registrar lo que le causa erupcin. Escriba los siguientes datos: Lo que come. Los cosmticos que [place]. Lo que bebe. La ropa que [place]. [place] alhajas. Concurra a todas las visitas de seguimiento como se lo haya indicado el mdico. Esto es importante. Comunquese con un mdico si: Transpira de noche. Pierde peso. Orina ms de lo normal. Orina menos de lo normal u observa que la orina es de color ms oscuro que lo habitual. Se siente dbil. Vomita. Tiene un color amarillo en la piel o en las partes blancas del ojo (ictericia). La piel: [place]ormiguea. Se adormece. La erupcin cutnea: No desaparece despus [place]. Empeora. Usted: Est inusualmente sediento. Est ms cansado que lo habitual. Tiene los siguientes sntomas: Sntomas nuevos. Dolor en el abdomen. Cristy Hilts. Diarrea. Solicite ayuda de inmediato si: Tiene fiebre y los sntomas empeoran repentinamente. Presenta confusin. Tiene un dolor de cabeza intenso o rigidez en el cuello. Tiene dolor intenso o rigidez [employer]  articulaciones. Tiene una convulsin. Presenta una erupcin que cubre todo o  casi todo el cuerpo. La erupcin puede o no ser dolorosa. Le aparecen ampollas que: Se encuentran arriba de la erupcin. Se agrandan o crecen juntas. Son dolorosas. Estn dentro de la nariz o la boca. Presenta una erupcin que: Tiene pequeas manchas moradas, como si fueran pinchazos, en todo el cuerpo. Tiene un "ojo de buey" o se parece a un blanco de tiro. No est relacionada con una exposicin al sol, est enrojecida y duele, y produce descamacin de la piel. Resumen Una erupcin es un cambio en el color de la piel. Algunas erupciones desaparecen despus de algunos das, pero otras pueden durar [employer]. El objetivo del tratamiento es calmar la picazn y evitar que la erupcin se propague. [place] o aplquese los medicamentos de venta libre y los recetados solamente como se lo haya indicado el mdico. Comunquese con un mdico si tiene sntomas nuevos o los sntomas empeoran. Concurra a todas las visitas de seguimiento como se lo haya indicado el mdico. Esto es importante. Esta informacin no tiene Marine scientist el consejo del mdico. Asegrese de hacerle al mdico cualquier pregunta que tenga. Document Revised: 08/15/2021 Document Reviewed: 08/15/2021 Elsevier Patient Education  [place].

## 2022-10-22 NOTE — Progress Notes (Signed)
Patient ID: Heidi Ellis, female   DOB: [birth year], 47 y.o.   MRN: [id number]   Heidi Ellis, is a 47 y.o. female  GXQ:[id number]  [id number]  DOB - 15-Nov-1975  Chief Complaint  Patient presents with   Gynecologic Exam    Pap. Med refill - birth control. Discuss birth control.        Subjective:   Heidi Ellis is a 47 y.o. female here today for pap and rash that is resolving.  About 2 weeks ago, she had a URI that seems to have resolved but she has had an itchy rash on and off in her axilla, B hands and inner thighs.  It has gotten better with claritin.  Denies fever.  OCP helping with cramps.    No problems updated.  ALLERGIES: No Known Allergies  PAST MEDICAL HISTORY: Past Medical History:  Diagnosis Date   Allergic rhinitis     MEDICATIONS AT HOME: Prior to Admission medications   Medication Sig Start Date End Date Taking? Authorizing Provider  carbamide peroxide (DEBROX) 6.5 % OTIC solution Place 5 drops into the right ear 2 (two) times daily. 07/17/22   Elsie Stain, MD  cyclobenzaprine (FLEXERIL) 5 MG tablet Take 1 tablet (5 mg total) by mouth 3 (three) times daily as needed for muscle spasms in neck area/headaches. 07/17/22   Elsie Stain, MD  diclofenac Sodium (VOLTAREN) 1 % GEL Apply 2 g topically 4 (four) times daily. 07/17/22   Elsie Stain, MD  norethindrone (AYGESTIN) 5 MG tablet Take 1 tablet (5 mg total) by mouth daily. 07/17/22   Elsie Stain, MD  olopatadine (PATANOL) 0.1 % ophthalmic solution Place 1 drop into both eyes 2 (two) times daily. 07/17/22   Elsie Stain, MD    ROS: Neg HEENT Neg resp Neg cardiac Neg GI Neg GU Neg MS Neg psych Neg neuro  Objective:   Vitals:   10/22/22 1034  BP: 116/76  Pulse: 85  Temp: 97.7 F (36.5 C)  TempSrc: Oral  SpO2: 100%  Weight: 147 lb (66.7 kg)  Height: 5\' 4"  (1.626 m)   Exam General appearance : Awake, alert, not in any distress. Speech Clear. Not toxic  looking HEENT: Atraumatic and Normocephalic Neck: Supple, no JVD. No cervical lymphadenopathy.  Chest: Good air entry bilaterally, CTAB.  No rales/rhonchi/wheezing CVS: S1 S2 regular, no murmurs.  FW:YOVZCHYI genitalia WNL.  Speculum inserted.  Os appears normal.  Pap taken.  She is starting her period.  Bimanual unremarkable.   Extremities: B/L Lower Ext shows no edema, both legs are warm to touch Neurology: Awake alert, and oriented X 3, CN II-XII intact, Non focal Skin: No Rash  Data Review Lab Results  Component Value Date   HGBA1C 5.6 07/17/2022   HGBA1C 5.50 11/08/2015   HGBA1C 5.6 % 03/18/2013    Assessment & Plan   1. Pap smear for cervical cancer screening - Cytology - PAP([place])  2. Screening examination for STD (sexually transmitted disease) - Cervicovaginal ancillary only - RPR - HIV antibody (with reflex) - CBC with Differential/Platelet  3. Rash of both hands - RPR - CBC with Differential/Platelet  4. Viral exanthem Resolving  - RPR - CBC with Differential/Platelet    Return if symptoms worsen or fail to improve.  The patient was given clear instructions to go to ER or return to medical center if symptoms don't improve, worsen or new problems develop. The patient verbalized understanding. The patient was told to call to get  lab results if they haven't heard anything in the next week.      Freeman Caldron, PA-C [facility] and [facility] [place], [place]   10/22/2022, 11:01 AM

## 2022-10-23 LAB — CBC WITH DIFFERENTIAL/PLATELET
Basophils Absolute: 0.1 10*3/uL (ref 0.0–0.2)
Basos: 1 %
EOS (ABSOLUTE): 0.1 10*3/uL (ref 0.0–0.4)
Eos: 1 %
Hematocrit: 42.6 % (ref 34.0–46.6)
Hemoglobin: 13.9 g/dL (ref 11.1–15.9)
Immature Grans (Abs): 0 10*3/uL (ref 0.0–0.1)
Immature Granulocytes: 1 %
Lymphocytes Absolute: 1.6 10*3/uL (ref 0.7–3.1)
Lymphs: 25 %
MCH: 28 pg (ref 26.6–33.0)
MCHC: 32.6 g/dL (ref 31.5–35.7)
MCV: 86 fL (ref 79–97)
Monocytes Absolute: 0.4 10*3/uL (ref 0.1–0.9)
Monocytes: 7 %
Neutrophils Absolute: 4.2 10*3/uL (ref 1.4–7.0)
Neutrophils: 65 %
Platelets: 304 10*3/uL (ref 150–450)
RBC: 4.96 x10E6/uL (ref 3.77–5.28)
RDW: 15 % (ref 11.7–15.4)
WBC: 6.3 10*3/uL (ref 3.4–10.8)

## 2022-10-23 LAB — HIV ANTIBODY (ROUTINE TESTING W REFLEX): HIV Screen 4th Generation wRfx: NONREACTIVE

## 2022-10-23 LAB — CERVICOVAGINAL ANCILLARY ONLY
Bacterial Vaginitis (gardnerella): NEGATIVE
Candida Glabrata: NEGATIVE
Candida Vaginitis: NEGATIVE
Chlamydia: NEGATIVE
Comment: NEGATIVE
Comment: NEGATIVE
Comment: NEGATIVE
Comment: NEGATIVE
Comment: NEGATIVE
Comment: NORMAL
Neisseria Gonorrhea: NEGATIVE
Trichomonas: NEGATIVE

## 2022-10-23 LAB — RPR: RPR Ser Ql: NONREACTIVE

## 2022-10-24 LAB — CYTOLOGY - PAP
Comment: NEGATIVE
Diagnosis: NEGATIVE
High risk HPV: NEGATIVE

## 2022-10-26 LAB — FECAL OCCULT BLOOD, IMMUNOCHEMICAL: Fecal Occult Bld: NEGATIVE

## 2022-10-27 ENCOUNTER — Telehealth: Payer: Self-pay

## 2022-10-27 NOTE — Telephone Encounter (Signed)
-----  Message from Charlott Rakes, MD sent at 10/27/2022  8:27 AM EST ----- Can you please inform her stool test to screen for colon cancer is negative.

## 2022-10-27 NOTE — Telephone Encounter (Signed)
Called patient but unable to make contact or leave voicemail

## 2022-10-30 ENCOUNTER — Other Ambulatory Visit: Payer: Self-pay

## 2022-10-31 ENCOUNTER — Telehealth: Payer: Self-pay

## 2022-10-31 NOTE — Telephone Encounter (Signed)
Called both number on file and stated that the call cannot be completed at this time    Information routed to the [place] pool

## 2022-10-31 NOTE — Telephone Encounter (Signed)
-----  Message from Carilyn Goodpasture, RN sent at 10/30/2022  5:46 PM EST -----  ----- Message ----- From: Argentina Donovan, PA-C Sent: 10/24/2022   1:56 PM EST To: Carilyn Goodpasture, RN  Pap is normal. Next pap will be due in 3 years. Thanks, Freeman Caldron, PA-C

## 2022-11-27 ENCOUNTER — Other Ambulatory Visit: Payer: Self-pay

## 2022-11-28 ENCOUNTER — Other Ambulatory Visit: Payer: Self-pay

## 2022-12-02 ENCOUNTER — Other Ambulatory Visit: Payer: Self-pay

## 2022-12-30 ENCOUNTER — Other Ambulatory Visit: Payer: Self-pay

## 2023-01-01 ENCOUNTER — Other Ambulatory Visit: Payer: Self-pay

## 2023-01-28 ENCOUNTER — Other Ambulatory Visit: Payer: Self-pay

## 2023-02-27 ENCOUNTER — Other Ambulatory Visit: Payer: Self-pay | Admitting: Critical Care Medicine

## 2023-02-27 ENCOUNTER — Other Ambulatory Visit: Payer: Self-pay

## 2023-02-27 MED ORDER — NORETHINDRONE ACETATE 5 MG PO TABS
5.0000 mg | ORAL_TABLET | Freq: Every day | ORAL | 0 refills | Status: DC
  Filled 2023-02-27 – 2023-03-06 (×2): qty 30, 30d supply, fill #0

## 2023-03-05 ENCOUNTER — Other Ambulatory Visit: Payer: Self-pay

## 2023-03-06 ENCOUNTER — Other Ambulatory Visit: Payer: Self-pay

## 2023-03-30 ENCOUNTER — Other Ambulatory Visit: Payer: Self-pay

## 2023-03-30 ENCOUNTER — Other Ambulatory Visit: Payer: Self-pay | Admitting: Critical Care Medicine

## 2023-03-30 MED ORDER — NORETHINDRONE ACETATE 5 MG PO TABS
5.0000 mg | ORAL_TABLET | Freq: Every day | ORAL | 0 refills | Status: DC
  Filled 2023-03-30: qty 30, 30d supply, fill #0

## 2023-04-02 ENCOUNTER — Other Ambulatory Visit: Payer: Self-pay

## 2023-04-30 ENCOUNTER — Other Ambulatory Visit: Payer: Self-pay | Admitting: Critical Care Medicine

## 2023-04-30 ENCOUNTER — Other Ambulatory Visit: Payer: Self-pay

## 2023-04-30 MED ORDER — NORETHINDRONE ACETATE 5 MG PO TABS
5.0000 mg | ORAL_TABLET | Freq: Every day | ORAL | 0 refills | Status: DC
  Filled 2023-04-30: qty 30, 30d supply, fill #0

## 2023-05-04 ENCOUNTER — Other Ambulatory Visit: Payer: Self-pay

## 2023-06-03 ENCOUNTER — Other Ambulatory Visit: Payer: Self-pay | Admitting: Critical Care Medicine

## 2023-06-03 ENCOUNTER — Other Ambulatory Visit: Payer: Self-pay

## 2023-06-03 MED ORDER — NORETHINDRONE ACETATE 5 MG PO TABS
5.0000 mg | ORAL_TABLET | Freq: Every day | ORAL | 0 refills | Status: DC
  Filled 2023-06-03: qty 30, 30d supply, fill #0

## 2023-06-11 ENCOUNTER — Other Ambulatory Visit: Payer: Self-pay

## 2023-06-11 ENCOUNTER — Encounter: Payer: Self-pay | Admitting: Critical Care Medicine

## 2023-06-11 ENCOUNTER — Ambulatory Visit: Payer: Self-pay | Attending: Critical Care Medicine | Admitting: Critical Care Medicine

## 2023-06-11 VITALS — BP 116/75 | HR 74 | Ht 64.0 in | Wt 146.0 lb

## 2023-06-11 DIAGNOSIS — N921 Excessive and frequent menstruation with irregular cycle: Secondary | ICD-10-CM

## 2023-06-11 DIAGNOSIS — N946 Dysmenorrhea, unspecified: Secondary | ICD-10-CM

## 2023-06-11 DIAGNOSIS — R42 Dizziness and giddiness: Secondary | ICD-10-CM

## 2023-06-11 DIAGNOSIS — Z1231 Encounter for screening mammogram for malignant neoplasm of breast: Secondary | ICD-10-CM

## 2023-06-11 DIAGNOSIS — G44209 Tension-type headache, unspecified, not intractable: Secondary | ICD-10-CM

## 2023-06-11 DIAGNOSIS — H6121 Impacted cerumen, right ear: Secondary | ICD-10-CM

## 2023-06-11 DIAGNOSIS — J309 Allergic rhinitis, unspecified: Secondary | ICD-10-CM

## 2023-06-11 MED ORDER — OLOPATADINE HCL 0.1 % OP SOLN
1.0000 [drp] | Freq: Two times a day (BID) | OPHTHALMIC | 12 refills | Status: DC
  Filled 2023-06-11: qty 5, 25d supply, fill #0

## 2023-06-11 MED ORDER — CYCLOBENZAPRINE HCL 5 MG PO TABS
5.0000 mg | ORAL_TABLET | Freq: Three times a day (TID) | ORAL | 1 refills | Status: AC | PRN
  Filled 2023-06-11: qty 60, 20d supply, fill #0

## 2023-06-11 MED ORDER — AZELASTINE HCL 0.1 % NA SOLN
2.0000 | Freq: Two times a day (BID) | NASAL | 12 refills | Status: AC
  Filled 2023-06-11: qty 30, 50d supply, fill #0

## 2023-06-11 MED ORDER — DICLOFENAC SODIUM 1 % EX GEL
2.0000 g | Freq: Four times a day (QID) | CUTANEOUS | 2 refills | Status: DC
  Filled 2023-06-11: qty 100, 13d supply, fill #0

## 2023-06-11 MED ORDER — NORETHINDRONE ACETATE 5 MG PO TABS
5.0000 mg | ORAL_TABLET | Freq: Every day | ORAL | 0 refills | Status: DC
  Filled 2023-06-11 – 2023-07-03 (×2): qty 30, 30d supply, fill #0

## 2023-06-11 NOTE — Progress Notes (Signed)
New Patient Office Visit  Subjective    Patient ID: Heidi Ellis, female    DOB: 12-31-1975  Age: 47 y.o. MRN: [id number]  CC:  Chief Complaint  Patient presents with   Dry Eye    Red, teary eyes.    HPI 07/2022 Heidi Ellis presents to establish care This is a pleasant 47 year old female who is a former primary care patient of Dr. Jillyn Hidden now here to reestablish care last seen in this clinic in February 2022.  Visit was assisted by Spanish interpreter Ivin Booty [phone] on [facility] interpreter audio.  Patient has multiple complaints.  #1 complains of right knee pain which is chronic #2 chronic low back pain which she has been seen by sports medicine in the past also has scoliosis and thoracic spine #3 right eye itching and irritation and vision change #4 decreased hearing in the right ear with ear pain #5 menstrual cycle dysmenorrhea and excess bleeding.  Patient also needs health screenings and colon cancer screenings.  Patient declines flu vaccine.  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  No other chronic health history seen she has had both of her tubes ligated  8/29 This patient seen in follow-up has not been seen since October 2023 by me but was seen in January 2024 by our PA for a Pap smear which was normal.  Patient presents today with some dry eye and eye irritation.  Also some lightheadedness and dizziness but not passing out.  Patient needs a mammogram and follow-up gynecology visit.  She has already seen for Pap smear and this was negative in January.  She is on the Aygestin for menorrhagia and this has controlled this condition.  She does have irregular menstrual cycles I would like gynecology to assess.  Outpatient Encounter Medications as of 06/11/2023  Medication Sig   azelastine (ASTELIN) 0.1 % nasal spray Place 2 sprays into both nostrils 2 (two) times  daily. Use in each nostril as directed   [DISCONTINUED] diclofenac Sodium (VOLTAREN) 1 % GEL Apply 2 g topically 4 (four) times daily.   [DISCONTINUED] norethindrone (AYGESTIN) 5 MG tablet Take 1 tablet (5 mg total) by mouth daily.   [DISCONTINUED] norethindrone (AYGESTIN) 5 MG tablet Take 5 mg by mouth daily.   [DISCONTINUED] olopatadine (PATANOL) 0.1 % ophthalmic solution Place 1 drop into both eyes 2 (two) times daily.   cyclobenzaprine (FLEXERIL) 5 MG tablet Take 1 tablet (5 mg total) by mouth 3 (three) times daily as needed for muscle spasms in neck area/headaches.   diclofenac Sodium (VOLTAREN) 1 % GEL Apply 2 g topically 4 (four) times daily.   norethindrone (AYGESTIN) 5 MG tablet Take 1 tablet (5 mg total) by mouth daily.   olopatadine (PATANOL) 0.1 % ophthalmic solution Place 1 drop into both eyes 2 (two) times daily.   [DISCONTINUED] carbamide peroxide (DEBROX) 6.5 % OTIC solution Place 5 drops into the right ear 2 (two) times daily. (Patient not taking: Reported on 06/11/2023)   [DISCONTINUED] cyclobenzaprine (FLEXERIL) 5 MG tablet Take 1 tablet (5 mg total) by mouth 3 (three) times daily as needed for muscle spasms in neck area/headaches. (Patient not taking: Reported on 06/11/2023)   No facility-administered encounter medications on file as of 06/11/2023.    Past Medical History:  Diagnosis Date   Allergic rhinitis    Cerumen impaction 07/17/2022    Past Surgical History:  Procedure Laterality Date   TUBAL  LIGATION      Family History  Problem Relation Age of Onset   Asthma Mother    Cancer Neg Hx    Heart disease Neg Hx     Social History   Socioeconomic History   Marital status: Single    Spouse name: Not on file   Number of children: Not on file   Years of education: Not on file   Highest education level: Not on file  Occupational History   Not on file  Tobacco Use   Smoking status: Never   Smokeless tobacco: Never  Vaping Use   Vaping status: Never Used   Substance and Sexual Activity   Alcohol use: No   Drug use: No   Sexual activity: Not Currently    Birth control/protection: None, Surgical  Other Topics Concern   Not on file  Social History Narrative   Not on file   Social Determinants of Health   Financial Resource Strain: Not on file  Food Insecurity: Not on file  Transportation Needs: Not on file  Physical Activity: Not on file  Stress: Not on file  Social Connections: Not on file  Intimate Partner Violence: Not on file    Review of Systems  Constitutional:  Negative for chills, diaphoresis, fever, malaise/fatigue and weight loss.  HENT:  Positive for ear pain. Negative for congestion, hearing loss, nosebleeds, sore throat and tinnitus.   Eyes:  Positive for blurred vision, discharge and redness. Negative for photophobia.       Eye itching  Respiratory:  Negative for cough, hemoptysis, sputum production, shortness of breath, wheezing and stridor.   Cardiovascular:  Negative for chest pain, palpitations, orthopnea, claudication, leg swelling and PND.  Gastrointestinal:  Negative for abdominal pain, blood in stool, constipation, diarrhea, heartburn, nausea and vomiting.  Genitourinary:  Negative for dysuria, flank pain, frequency, hematuria and urgency.  Musculoskeletal:  Positive for back pain. Negative for falls, joint pain, myalgias and neck pain.  Skin:  Negative for itching and rash.  Neurological:  Negative for dizziness, tingling, tremors, sensory change, speech change, focal weakness, seizures, loss of consciousness, weakness and headaches.  Endo/Heme/Allergies:  Negative for environmental allergies and polydipsia. Does not bruise/bleed easily.  Psychiatric/Behavioral:  Negative for depression, memory loss, substance abuse and suicidal ideas. The patient is not nervous/anxious and does not have insomnia.         Objective    BP 116/75 (BP Location: Left Arm, Patient Position: Sitting, Cuff Size: Normal)   Pulse  74   Ht 5\' 4"  (1.626 m)   Wt 146 lb (66.2 kg)   LMP  (LMP Unknown)   SpO2 99%   BMI 25.06 kg/m   Physical Exam Vitals reviewed.  Constitutional:      Appearance: Normal appearance. She is well-developed. She is not diaphoretic.  HENT:     Head: Normocephalic and atraumatic.     Right Ear: External ear normal. There is no impacted cerumen.     Left Ear: Tympanic membrane, ear canal and external ear normal. There is no impacted cerumen.     Nose: Congestion and rhinorrhea present. No nasal deformity, septal deviation or mucosal edema.     Right Sinus: No maxillary sinus tenderness or frontal sinus tenderness.     Left Sinus: No maxillary sinus tenderness or frontal sinus tenderness.     Mouth/Throat:     Mouth: Mucous membranes are moist.     Pharynx: Oropharynx is clear. No oropharyngeal exudate.  Eyes:  General: No scleral icterus.       Right eye: No discharge.        Left eye: No discharge.     Extraocular Movements: Extraocular movements intact.     Pupils: Pupils are equal, round, and reactive to light.     Comments: Evidence of allergic conjunctivitis right greater than left eye  Neck:     Thyroid: No thyromegaly.     Vascular: No carotid bruit or JVD.     Trachea: Trachea normal. No tracheal tenderness or tracheal deviation.  Cardiovascular:     Rate and Rhythm: Normal rate and regular rhythm.     Chest Wall: PMI is not displaced.     Pulses: Normal pulses. No decreased pulses.     Heart sounds: Normal heart sounds, S1 normal and S2 normal. Heart sounds not distant. No murmur heard.    No systolic murmur is present.     No diastolic murmur is present.     No friction rub. No gallop. No S3 or S4 sounds.  Pulmonary:     Effort: No tachypnea, accessory muscle usage or respiratory distress.     Breath sounds: No stridor. No decreased breath sounds, wheezing, rhonchi or rales.  Chest:     Chest wall: No tenderness.  Abdominal:     General: Bowel sounds are normal.  There is no distension.     Palpations: Abdomen is soft. Abdomen is not rigid.     Tenderness: There is no abdominal tenderness. There is no guarding or rebound.  Musculoskeletal:        General: Tenderness present. Normal range of motion.     Cervical back: Normal range of motion and neck supple. No edema, erythema or rigidity. No muscular tenderness. Normal range of motion.     Comments: Evidence of bilateral chondromalacia of the patella both knees  Lymphadenopathy:     Head:     Right side of head: No submental or submandibular adenopathy.     Left side of head: No submental or submandibular adenopathy.     Cervical: No cervical adenopathy.  Skin:    General: Skin is warm and dry.     Coloration: Skin is not pale.     Findings: No rash.     Nails: There is no clubbing.  Neurological:     General: No focal deficit present.     Mental Status: She is alert and oriented to person, place, and time.     Sensory: No sensory deficit.  Psychiatric:        Mood and Affect: Mood normal.        Speech: Speech normal.        Behavior: Behavior normal.        Thought Content: Thought content normal.        Judgment: Judgment normal.    Right ear cleaned with Debrox and lavage per nursing     Assessment & Plan:   Problem List Items Addressed This Visit       Respiratory   Allergic rhinitis    With associated allergic conjunctivitis plan to refill Patanol and give Astelin nasal spray        Nervous and Auditory   RESOLVED: Cerumen impaction    This has resolved        Genitourinary   Dysmenorrhea    Continue Aygestin and refer to gynecology        Other   Menorrhagia   Relevant Orders   Ambulatory  referral to Gynecology   Dizziness - Primary    Unclear etiology may be related to allergic rhinitis assess labs      Relevant Orders   BMP8+eGFR   CBC with Differential/Platelet   Other Visit Diagnoses     Tension headache       Relevant Medications    cyclobenzaprine (FLEXERIL) 5 MG tablet   Encounter for screening mammogram for malignant neoplasm of breast       Relevant Orders   MM DIGITAL SCREENING BILATERAL       Will obtain mammogram through breast cancer control program scholarship and assist patient getting the orange card Return in about 6 months (around 12/11/2023) for primary care follow up.   Shan Levans, MD

## 2023-06-11 NOTE — Assessment & Plan Note (Signed)
Continue Aygestin and refer to gynecology

## 2023-06-11 NOTE — Assessment & Plan Note (Signed)
Unclear etiology may be related to allergic rhinitis assess labs

## 2023-06-11 NOTE — Assessment & Plan Note (Signed)
With associated allergic conjunctivitis plan to refill Patanol and give Astelin nasal spray

## 2023-06-11 NOTE — Assessment & Plan Note (Signed)
This has resolved.

## 2023-06-11 NOTE — Patient Instructions (Addendum)
Medication refilled Nasal spray [facility]erral to [facility] today Go to [facility] for eye exam

## 2023-06-12 ENCOUNTER — Telehealth: Payer: Self-pay

## 2023-06-12 LAB — CBC WITH DIFFERENTIAL/PLATELET
Basophils Absolute: 0.1 10*3/uL (ref 0.0–0.2)
Basos: 2 %
EOS (ABSOLUTE): 0.1 10*3/uL (ref 0.0–0.4)
Eos: 2 %
Hematocrit: 45.8 % (ref 34.0–46.6)
Hemoglobin: 14.7 g/dL (ref 11.1–15.9)
Immature Grans (Abs): 0 10*3/uL (ref 0.0–0.1)
Immature Granulocytes: 0 %
Lymphocytes Absolute: 1.4 10*3/uL (ref 0.7–3.1)
Lymphs: 26 %
MCH: 29.3 pg (ref 26.6–33.0)
MCHC: 32.1 g/dL (ref 31.5–35.7)
MCV: 91 fL (ref 79–97)
Monocytes Absolute: 0.4 10*3/uL (ref 0.1–0.9)
Monocytes: 7 %
Neutrophils Absolute: 3.4 10*3/uL (ref 1.4–7.0)
Neutrophils: 63 %
Platelets: 282 10*3/uL (ref 150–450)
RBC: 5.01 x10E6/uL (ref 3.77–5.28)
RDW: 12.6 % (ref 11.7–15.4)
WBC: 5.3 10*3/uL (ref 3.4–10.8)

## 2023-06-12 LAB — BMP8+EGFR
BUN/Creatinine Ratio: 17 (ref 9–23)
BUN: 13 mg/dL (ref 6–24)
CO2: 20 mmol/L (ref 20–29)
Calcium: 9.6 mg/dL (ref 8.7–10.2)
Chloride: 104 mmol/L (ref 96–106)
Creatinine, Ser: 0.75 mg/dL (ref 0.57–1.00)
Glucose: 77 mg/dL (ref 70–99)
Potassium: 4.7 mmol/L (ref 3.5–5.2)
Sodium: 139 mmol/L (ref 134–144)
eGFR: 99 mL/min/{1.73_m2} (ref >59)

## 2023-06-12 NOTE — Progress Notes (Signed)
Let pt know labs all normal

## 2023-06-12 NOTE — Telephone Encounter (Signed)
-----   Message from Shan Levans sent at 06/12/2023  8:20 AM EDT ----- Let pt know labs all normal

## 2023-06-12 NOTE — Telephone Encounter (Signed)
Called patient unable to make contact or leave voicemail, information sent to the nurse pool

## 2023-06-18 ENCOUNTER — Other Ambulatory Visit: Payer: Self-pay

## 2023-07-03 ENCOUNTER — Other Ambulatory Visit: Payer: Self-pay

## 2023-07-07 ENCOUNTER — Other Ambulatory Visit: Payer: Self-pay

## 2023-07-15 ENCOUNTER — Telehealth: Payer: Self-pay

## 2023-07-15 NOTE — Telephone Encounter (Signed)
Patient came in requesting a referral for Optomology, patient has the [employer]

## 2023-07-16 NOTE — Telephone Encounter (Signed)
Called both number on file and both stated that they are out of service   Interpreter id # [phone]

## 2023-08-05 ENCOUNTER — Other Ambulatory Visit: Payer: Self-pay | Admitting: Critical Care Medicine

## 2023-08-06 ENCOUNTER — Other Ambulatory Visit: Payer: Self-pay

## 2023-08-06 MED ORDER — NORETHINDRONE ACETATE 5 MG PO TABS
5.0000 mg | ORAL_TABLET | Freq: Every day | ORAL | 0 refills | Status: DC
  Filled 2023-08-06: qty 30, 30d supply, fill #0
  Filled 2023-09-03: qty 30, 30d supply, fill #1
  Filled 2023-10-08 (×2): qty 30, 30d supply, fill #2

## 2023-08-06 NOTE — Telephone Encounter (Signed)
Requested Prescriptions  Pending Prescriptions Disp Refills   norethindrone (AYGESTIN) 5 MG tablet 90 tablet 0    Sig: Take 1 tablet (5 mg total) by mouth daily.(Must have office visit for refills)     OB/GYN: Contraceptives - Progestins Passed - 08/05/2023  9:26 AM      Passed - Last BP in normal range    BP Readings from Last 1 Encounters:  06/11/23 116/75         Passed - Valid encounter within last 12 months    Recent Outpatient Visits           1 month ago Dizziness   [facility] Storm Frisk, MD   9 months ago Pap smear for cervical cancer screening   [facility] [place], Marzella Schlein, [place]   1 year ago Lumbar radiculopathy   [facility] Storm Frisk, MD   2 years ago Dysfunction of Eustachian tube, unspecified laterality   [facility] [place], [place], [place]   3 years ago Gastroesophageal reflux disease, unspecified whether esophagitis present   [facility] [place], [place], NP       Future Appointments             In 4 months Claiborne Rigg, NP [employer] [facility]            Passed - Patient is not a smoker

## 2023-08-07 ENCOUNTER — Other Ambulatory Visit: Payer: Self-pay

## 2023-08-11 ENCOUNTER — Ambulatory Visit: Admission: RE | Admit: 2023-08-11 | Payer: Self-pay | Source: Ambulatory Visit

## 2023-09-03 ENCOUNTER — Other Ambulatory Visit: Payer: Self-pay

## 2023-09-07 ENCOUNTER — Other Ambulatory Visit: Payer: Self-pay

## 2023-10-08 ENCOUNTER — Other Ambulatory Visit: Payer: Self-pay

## 2023-10-09 ENCOUNTER — Other Ambulatory Visit: Payer: Self-pay

## 2023-11-05 ENCOUNTER — Ambulatory Visit
Admission: RE | Admit: 2023-11-05 | Discharge: 2023-11-05 | Disposition: A | Discharge status: Home / Self Care | Payer: No Typology Code available for payment source | Source: Ambulatory Visit | Attending: Critical Care Medicine | Admitting: Critical Care Medicine

## 2023-11-05 DIAGNOSIS — Z1231 Encounter for screening mammogram for malignant neoplasm of breast: Secondary | ICD-10-CM

## 2023-11-10 ENCOUNTER — Other Ambulatory Visit: Payer: Self-pay | Admitting: Physician Assistant

## 2023-11-10 DIAGNOSIS — R928 Other abnormal and inconclusive findings on diagnostic imaging of breast: Secondary | ICD-10-CM

## 2023-11-19 ENCOUNTER — Encounter: Payer: Self-pay | Admitting: Family Medicine

## 2023-11-26 ENCOUNTER — Ambulatory Visit: Payer: No Typology Code available for payment source

## 2023-12-08 ENCOUNTER — Other Ambulatory Visit: Payer: Self-pay

## 2023-12-08 DIAGNOSIS — R928 Other abnormal and inconclusive findings on diagnostic imaging of breast: Secondary | ICD-10-CM

## 2023-12-11 ENCOUNTER — Ambulatory Visit: Payer: Self-pay | Admitting: Nurse Practitioner

## 2023-12-17 ENCOUNTER — Ambulatory Visit: Payer: Self-pay | Admitting: *Deleted

## 2023-12-17 ENCOUNTER — Other Ambulatory Visit: Payer: Self-pay | Admitting: Obstetrics and Gynecology

## 2023-12-17 ENCOUNTER — Ambulatory Visit
Admission: RE | Admit: 2023-12-17 | Discharge: 2023-12-17 | Disposition: A | Discharge status: Home / Self Care | Payer: No Typology Code available for payment source | Source: Ambulatory Visit | Attending: Obstetrics and Gynecology | Admitting: Obstetrics and Gynecology

## 2023-12-17 VITALS — BP 112/79 | Wt 150.0 lb

## 2023-12-17 DIAGNOSIS — Z1211 Encounter for screening for malignant neoplasm of colon: Secondary | ICD-10-CM

## 2023-12-17 DIAGNOSIS — N6310 Unspecified lump in the right breast, unspecified quadrant: Secondary | ICD-10-CM

## 2023-12-17 DIAGNOSIS — R928 Other abnormal and inconclusive findings on diagnostic imaging of breast: Secondary | ICD-10-CM

## 2023-12-17 DIAGNOSIS — Z1239 Encounter for other screening for malignant neoplasm of breast: Secondary | ICD-10-CM

## 2023-12-17 NOTE — Progress Notes (Signed)
 Ms. Pavneet Markwood is a 48 y.o. female who presents to [facility] clinic today with no complaints. Patient referred to BCCCP due to having a screening mammogram completed 11/05/2023 that additional imaging of the right breast breast is recommended for follow up.   Pap Smear: Pap smear not completed today. Last Pap smear was 10/22/2022 at [facility] and was normal with negative HPV. Per patient has no history of an abnormal Pap smear. Last Pap smear result is available in Epic.   Physical exam: Breasts Breasts symmetrical. No skin abnormalities bilateral breasts. No nipple retraction bilateral breasts. No nipple discharge bilateral breasts. No lymphadenopathy. No lumps palpated bilateral breasts. No complaints of pain or tenderness on exam.  MS 3D SCR MAMMO BILAT BR (aka MM) Result Date: 11/09/2023 CLINICAL DATA:  Screening. EXAM: DIGITAL SCREENING BILATERAL MAMMOGRAM WITH TOMOSYNTHESIS AND CAD TECHNIQUE: Bilateral screening digital craniocaudal and mediolateral oblique mammograms were obtained. Bilateral screening digital breast tomosynthesis was performed. The images were evaluated with computer-aided detection. COMPARISON:  None available. ACR Breast Density Category d: The breasts are extremely dense, which lowers the sensitivity of mammography. FINDINGS: In the right breast, a possible mass warrants further evaluation. In the left breast, no findings suspicious for malignancy. IMPRESSION: Further evaluation is suggested for possible mass in the right breast. RECOMMENDATION: Ultrasound of the right breast. (Code:[id number]-R-52M) The patient will be contacted regarding the findings, and additional imaging will be scheduled. BI-RADS CATEGORY  0: Incomplete: Need additional imaging evaluation. Electronically Signed   By: Frederico Hamman M.D.   On: 11/09/2023 10:03       Pelvic/Bimanual Pap is not indicated today per BCCCP guidelines.   Smoking History: Patient has never  smoked.   Patient Navigation: Patient education provided. Access to services provided for patient through [facility] program. Spanish interpreter Natale Lay from [facility] provided.   Colorectal Cancer Screening: Per patient has never had colonoscopy completed. Patient completed a FIT Test 10/22/2022 that was negative. FIT Test given to patient today to complete. No complaints today.    Breast and Cervical Cancer Risk Assessment: Patient does not have family history of breast cancer, known genetic mutations, or radiation treatment to the chest before age 66. Patient does not have history of cervical dysplasia, immunocompromised, or DES exposure in-utero.  Risk Scores as of Encounter on 12/17/2023     Dondra Spry           5-year 0.57%   Lifetime 6.85%            Last calculated by Caprice Red, CMA on 12/17/2023 at 11:18 AM        A: BCCCP exam without pap smear No complaints.  P: Referred patient to the [facility] for a diagnostic mammogram per recommendation. Appointment scheduled Thursday, December 17, 2023 at 1320.  Priscille Heidelberg, RN 12/17/2023 11:36 AM

## 2023-12-17 NOTE — Patient Instructions (Signed)
 Explained breast self awareness with Heidi Ellis. Patient did not need a Pap smear today due to last Pap smear and HPV Typing was 10/22/2022. Let her know BCCCP will cover Pap smears and HPV typing every 5 years unless has a history of abnormal Pap smears. Referred patient to the [facility] for a diagnostic mammogram per recommendation. Appointment scheduled Thursday, December 17, 2023 at 1320. Patient aware of appointment and will be there. Heidi Ellis verbalized understanding.  Maryetta Shafer, Kathaleen Maser, RN 11:36 AM

## 2023-12-30 ENCOUNTER — Ambulatory Visit: Payer: Self-pay | Attending: Physician Assistant | Admitting: Physician Assistant

## 2023-12-30 ENCOUNTER — Encounter: Payer: Self-pay | Admitting: Physician Assistant

## 2023-12-30 ENCOUNTER — Other Ambulatory Visit: Payer: Self-pay

## 2023-12-30 VITALS — BP 119/81 | HR 66 | Wt 151.8 lb

## 2023-12-30 DIAGNOSIS — H524 Presbyopia: Secondary | ICD-10-CM

## 2023-12-30 DIAGNOSIS — E785 Hyperlipidemia, unspecified: Secondary | ICD-10-CM

## 2023-12-30 DIAGNOSIS — H11001 Unspecified pterygium of right eye: Secondary | ICD-10-CM

## 2023-12-30 DIAGNOSIS — J309 Allergic rhinitis, unspecified: Secondary | ICD-10-CM

## 2023-12-30 MED ORDER — OLOPATADINE HCL 0.1 % OP SOLN
1.0000 [drp] | Freq: Two times a day (BID) | OPHTHALMIC | 12 refills | Status: AC
  Filled 2023-12-30: qty 5, 25d supply, fill #0

## 2023-12-30 NOTE — Progress Notes (Signed)
 Patient ID: Heidi Ellis, female   DOB: 1976-07-19, 48 y.o.   MRN: [id number]   Heidi Ellis, is a 48 y.o. female  [id number]  [id number]  DOB - 03-Sep-1976  Chief Complaint  Patient presents with   Blurred Vision       Subjective:   Heidi Ellis is a 48 y.o. female here today for a few issues.  Her R eye is itching and dry.  Using optivar with some relief.  Also is starting to need reading glasses.    She is concerned bc she is having to take allergy medications all year long and does not really know what all she is allergic to.  She would like to do allergy testing and see if she might benefit from allergy shots.    No problems updated.  ALLERGIES: No Known Allergies  PAST MEDICAL HISTORY: Past Medical History:  Diagnosis Date   Allergic rhinitis    Cerumen impaction 07/17/2022    MEDICATIONS AT HOME: Prior to Admission medications   Medication Sig Start Date End Date Taking? Authorizing Provider  azelastine (ASTELIN) 0.1 % nasal spray Place 2 sprays into both nostrils 2 (two) times daily. Use in each nostril as directed 06/11/23  Yes Storm Frisk, MD  Cetirizine HCl (ZYRTEC PO) Take by mouth.   Yes [provider]  cyclobenzaprine (FLEXERIL) 5 MG tablet Take 1 tablet (5 mg total) by mouth 3 (three) times daily as needed for muscle spasms in neck area/headaches. 06/11/23  Yes Storm Frisk, MD  olopatadine (PATANOL) 0.1 % ophthalmic solution Place 1 drop into both eyes 2 (two) times daily. 12/30/23   Avelyn Touch, Marzella Schlein, PA-C    ROS: Neg resp Neg cardiac Neg GI Neg GU Neg MS Neg psych Neg neuro  Objective:   Vitals:   12/30/23 1557  BP: 119/81  Pulse: 66  SpO2: 99%  Weight: 151 lb 12.8 oz (68.9 kg)   Exam General appearance : Awake, alert, not in any distress. Speech Clear. Not toxic looking HEENT: Atraumatic and Normocephalic, pupils equally reactive to light and accomodation, EOM intact B.  There is a pterygium in  the R eye from inner canthus into iris and not extending into the pupil.   Neck: Supple, no JVD. No cervical lymphadenopathy.  Chest: Good air entry bilaterally, CTAB.  No rales/rhonchi/wheezing CVS: S1 S2 regular, no murmurs.  Extremities: B/L Lower Ext shows no edema, both legs are warm to touch Neurology: Awake alert, and oriented X 3, CN II-XII intact, Non focal Skin: No Rash  Data Review Lab Results  Component Value Date   HGBA1C 5.6 07/17/2022   HGBA1C 5.50 11/08/2015   HGBA1C 5.6 % 03/18/2013    Assessment & Plan   1. Pterygium of right eye (Primary) - olopatadine (PATANOL) 0.1 % ophthalmic solution; Place 1 drop into both eyes 2 (two) times daily.  Dispense: 5 mL; Refill: 12 - Ambulatory referral to Ophthalmology  2. Allergic rhinitis, unspecified seasonality, unspecified trigger - Ambulatory referral to Allergy  3. Dyslipidemia - Lipid Panel; Future  4. Presbyopia of both eyes - olopatadine (PATANOL) 0.1 % ophthalmic solution; Place 1 drop into both eyes 2 (two) times daily.  Dispense: 5 mL; Refill: 12 - Ambulatory referral to Ophthalmology    Return in about 6 months (around 07/01/2024) for assign to one of our PCP.  The patient was given clear instructions to go to ER or return to medical center if symptoms don't improve, worsen or new problems develop. The patient  verbalized understanding. The patient was told to call to get lab results if they haven't heard anything in the next week.      Georgian Co, PA-C [facility] [place], [place] [phone]   12/30/2023, 5:13 PM

## 2023-12-31 ENCOUNTER — Ambulatory Visit: Attending: Family Medicine

## 2023-12-31 DIAGNOSIS — E785 Hyperlipidemia, unspecified: Secondary | ICD-10-CM

## 2024-01-01 ENCOUNTER — Encounter: Payer: Self-pay | Admitting: Physician Assistant

## 2024-01-01 LAB — LIPID PANEL
Chol/HDL Ratio: 4 ratio (ref 0.0–4.4)
Cholesterol, Total: 191 mg/dL (ref 100–199)
HDL: 48 mg/dL (ref >39)
LDL Chol Calc (NIH): 126 mg/dL — ABNORMAL HIGH (ref 0–99)
Triglycerides: 92 mg/dL (ref 0–149)
VLDL Cholesterol Cal: 17 mg/dL (ref 5–40)

## 2024-01-23 LAB — FECAL OCCULT BLOOD, IMMUNOCHEMICAL: Fecal Occult Bld: NEGATIVE

## 2024-06-20 ENCOUNTER — Ambulatory Visit
Admission: RE | Admit: 2024-06-20 | Discharge: 2024-06-20 | Disposition: A | Discharge status: Home / Self Care | Source: Ambulatory Visit | Attending: Obstetrics and Gynecology | Admitting: Obstetrics and Gynecology

## 2024-06-20 DIAGNOSIS — N6310 Unspecified lump in the right breast, unspecified quadrant: Secondary | ICD-10-CM

## 2024-06-21 ENCOUNTER — Ambulatory Visit: Payer: Self-pay | Admitting: Obstetrics and Gynecology

## 2024-06-21 DIAGNOSIS — R928 Other abnormal and inconclusive findings on diagnostic imaging of breast: Secondary | ICD-10-CM

## 2024-07-04 ENCOUNTER — Other Ambulatory Visit: Payer: Self-pay

## 2024-07-04 ENCOUNTER — Encounter: Payer: Self-pay | Admitting: Family Medicine

## 2024-07-04 ENCOUNTER — Ambulatory Visit: Payer: Self-pay | Attending: Family Medicine | Admitting: Family Medicine

## 2024-07-04 VITALS — BP 113/75 | HR 62 | Ht 64.0 in | Wt 153.8 lb

## 2024-07-04 DIAGNOSIS — R519 Headache, unspecified: Secondary | ICD-10-CM

## 2024-07-04 DIAGNOSIS — H04309 Unspecified dacryocystitis of unspecified lacrimal passage: Secondary | ICD-10-CM

## 2024-07-04 DIAGNOSIS — J3089 Other allergic rhinitis: Secondary | ICD-10-CM

## 2024-07-04 DIAGNOSIS — H527 Unspecified disorder of refraction: Secondary | ICD-10-CM

## 2024-07-04 MED ORDER — FLUTICASONE PROPIONATE 50 MCG/ACT NA SUSP
2.0000 | Freq: Every day | NASAL | 6 refills | Status: AC
  Filled 2024-07-04: qty 16, 30d supply, fill #0

## 2024-07-04 MED ORDER — LORATADINE 10 MG PO TABS
10.0000 mg | ORAL_TABLET | Freq: Every day | ORAL | 1 refills | Status: AC
  Filled 2024-07-04: qty 30, 30d supply, fill #0

## 2024-07-04 NOTE — Progress Notes (Signed)
 Subjective:  Patient ID: Heidi Ellis, female    DOB: Jan 25, 1976  Age: 48 y.o. MRN: [id number]  CC: Medical Management of Chronic Issues (Discuss new allergy medication/headaches)     Discussed the use of AI scribe software for clinical note transcription with the patient, who gave verbal consent to proceed.  History of Present Illness Heidi Ellis is a 48 year old female who presents with headaches and concerns about allergy medication side effects.  She experiences almost daily headaches, typically starting in the morning. She is uncertain if these are related to her allergies. She takes Zyrtec  for allergies but experiences drowsiness. She recalls loratadine  did not cause drowsiness and wants to switch back. She has sneezing and watery eyes, especially in the mornings, but no nasal congestion.  She has recurrent stye of the right eye with recurrent inflammation in both eyes, with episodes in June and July. She has difficulty reading and is concerned about her vision, prompting her desire to see an eye doctor.    Past Medical History:  Diagnosis Date   Allergic rhinitis    Cerumen impaction 07/17/2022    Past Surgical History:  Procedure Laterality Date   TUBAL LIGATION      Family History  Problem Relation Age of Onset   Asthma Mother    Cancer Neg Hx    Heart disease Neg Hx    Breast cancer Neg Hx     Social History   Socioeconomic History   Marital status: Divorced    Spouse name: Not on file   Number of children: 3   Years of education: Not on file   Highest education level: Not on file  Occupational History   Not on file  Tobacco Use   Smoking status: Never   Smokeless tobacco: Never  Vaping Use   Vaping status: Never Used  Substance and Sexual Activity   Alcohol use: No   Drug use: No   Sexual activity: Not Currently    Birth control/protection: None, Surgical  Other Topics Concern   Not on file  Social History Narrative   Pt was born  in [place]   Social Drivers of Health   Financial Resource Strain: Not on file  Food Insecurity: No Food Insecurity (12/30/2023)   Hunger Vital Sign    Worried About Running Out of Food in the Last Year: Never true    Ran Out of Food in the Last Year: Never true  Transportation Needs: No Transportation Needs (12/30/2023)   PRAPARE - Administrator, Civil Service (Medical): No    Lack of Transportation (Non-Medical): No  Physical Activity: Insufficiently Active (12/30/2023)   Exercise Vital Sign    Days of Exercise per Week: 3 days    Minutes of Exercise per Session: 40 min  Stress: No Stress Concern Present (12/30/2023)   [employer] of Occupational Health - Occupational Stress Questionnaire    Feeling of Stress : Not at all  Social Connections: Moderately Integrated (12/30/2023)   Social Connection and Isolation Panel    Frequency of Communication with Friends and Family: More than three times a week    Frequency of Social Gatherings with Friends and Family: Once a week    Attends Religious Services: 1 to 4 times per year    Active Member of [employer] or Organizations: Yes    Attends Banker Meetings: 1 to 4 times per year    Marital Status: Divorced    No Known Allergies  Outpatient  Medications Prior to Visit  Medication Sig Dispense Refill   azelastine  (ASTELIN ) 0.1 % nasal spray Place 2 sprays into both nostrils 2 (two) times daily. Use in each nostril as directed 30 mL 12   cyclobenzaprine  (FLEXERIL ) 5 MG tablet Take 1 tablet (5 mg total) by mouth 3 (three) times daily as needed for muscle spasms in neck area/headaches. 60 tablet 1   olopatadine  (PATANOL) 0.1 % ophthalmic solution Place 1 drop into both eyes 2 (two) times daily. 5 mL 12   Cetirizine  HCl (ZYRTEC  PO) Take by mouth.     No facility-administered medications prior to visit.     ROS Review of Systems  Constitutional:  Negative for activity change and appetite change.  HENT:  Positive  for postnasal drip. Negative for sinus pressure and sore throat.   Respiratory:  Negative for chest tightness, shortness of breath and wheezing.   Cardiovascular:  Negative for chest pain and palpitations.  Gastrointestinal:  Negative for abdominal distention, abdominal pain and constipation.  Genitourinary: Negative.   Musculoskeletal: Negative.   Neurological:  Positive for headaches.  Psychiatric/Behavioral:  Negative for behavioral problems and dysphoric mood.     Objective:  BP 113/75   Pulse 62   Ht 5' 4 (1.626 m)   Wt 153 lb 12.8 oz (69.8 kg)   SpO2 99%   BMI 26.40 kg/m      07/04/2024    4:27 PM 12/30/2023    3:57 PM 12/17/2023   11:06 AM  BP/Weight  Systolic BP 113 119 112  Diastolic BP 75 81 79  Wt. (Lbs) 153.8 151.8 150  BMI 26.4 kg/m2 26.06 kg/m2 25.75 kg/m2      Physical Exam Constitutional:      Appearance: She is well-developed.  HENT:     Right Ear: Tympanic membrane normal.     Left Ear: Tympanic membrane normal.     Mouth/Throat:     Mouth: Mucous membranes are moist.  Eyes:     Extraocular Movements: Extraocular movements intact.     Conjunctiva/sclera: Conjunctivae normal.  Cardiovascular:     Rate and Rhythm: Normal rate.     Heart sounds: Normal heart sounds. No murmur heard. Pulmonary:     Effort: Pulmonary effort is normal.     Breath sounds: Normal breath sounds. No wheezing or rales.  Chest:     Chest wall: No tenderness.  Abdominal:     General: Bowel sounds are normal. There is no distension.     Palpations: Abdomen is soft. There is no mass.     Tenderness: There is no abdominal tenderness.  Musculoskeletal:        General: Normal range of motion.     Right lower leg: No edema.     Left lower leg: No edema.  Neurological:     Mental Status: She is alert and oriented to person, place, and time.  Psychiatric:        Mood and Affect: Mood normal.        Latest Ref Rng & Units 06/11/2023   10:20 AM 07/17/2022   11:45 AM  07/16/2020    8:45 AM  CMP  Glucose 70 - 99 mg/dL 77  92  98   BUN 6 - 24 mg/dL 13  15  11    Creatinine 0.57 - 1.00 mg/dL 9.24  9.18  9.29   Sodium 134 - 144 mmol/L 139  137  140   Potassium 3.5 - 5.2 mmol/L 4.7  4.4  4.9  Chloride 96 - 106 mmol/L 104  100  104   CO2 20 - 29 mmol/L 20  23  24    Calcium 8.7 - 10.2 mg/dL 9.6  9.9  89.7   Total Protein 6.0 - 8.5 g/dL  7.5    Total Bilirubin 0.0 - 1.2 mg/dL  0.3    Alkaline Phos 44 - 121 IU/L  58    AST 0 - 40 IU/L  16    ALT 0 - 32 IU/L  13      Lipid Panel     Component Value Date/Time   CHOL 191 12/31/2023 0854   TRIG 92 12/31/2023 0854   HDL 48 12/31/2023 0854   CHOLHDL 4.0 12/31/2023 0854   CHOLHDL 4.2 11/08/2015 0951   VLDL 28 11/08/2015 0951   LDLCALC 126 (H) 12/31/2023 0854    CBC    Component Value Date/Time   WBC 5.3 06/11/2023 1020   WBC 5.1 11/08/2015 0951   RBC 5.01 06/11/2023 1020   RBC 4.49 11/08/2015 0951   HGB 14.7 06/11/2023 1020   HCT 45.8 06/11/2023 1020   PLT 282 06/11/2023 1020   MCV 91 06/11/2023 1020   MCH 29.3 06/11/2023 1020   MCH 23.6 (L) 11/08/2015 0951   MCHC 32.1 06/11/2023 1020   MCHC 30.6 11/08/2015 0951   RDW 12.6 06/11/2023 1020   LYMPHSABS 1.4 06/11/2023 1020   MONOABS 0.4 11/08/2015 0951   EOSABS 0.1 06/11/2023 1020   BASOSABS 0.1 06/11/2023 1020    Lab Results  Component Value Date   HGBA1C 5.6 07/17/2022       Assessment & Plan Allergic rhinitis Sinus headache Allergic rhinitis with headaches, likely exacerbated by Zyrtec  causing drowsiness. Headaches may be allergy-related. - Discontinue Zyrtec  due to drowsiness. - Prescribe loratadine  for allergy management. - Consider nasal spray if congestion develops. - Send prescription to [facility] pharmacy.  Recurrent dacryocystitis Refractive error Recurrent eye inflammation leading to recurrent stye with visual disturbances, including difficulty reading. Possible duct blockage and inflammation. - Refer to  an eye specialist for evaluation of recurrent eye inflammation and pterygium. - Evaluate visual disturbances and potential duct blockage.       Healthcare maintenance Up-to-date on mammogram  Meds ordered this encounter  Medications   loratadine  (CLARITIN ) 10 MG tablet    Sig: Take 1 tablet (10 mg total) by mouth daily.    Dispense:  90 tablet    Refill:  1   fluticasone  (FLONASE ) 50 MCG/ACT nasal spray    Sig: Place 2 sprays into both nostrils daily.    Dispense:  16 g    Refill:  6    Follow-up: Return in about 6 months (around 01/01/2025) for CPE/ Preventive Health Exam.       Corrina Sabin, MD, FAAFP. [facility] [place], [place] [phone]   07/04/2024, 5:16 PM

## 2024-07-04 NOTE — Patient Instructions (Signed)

## 2025-01-02 ENCOUNTER — Ambulatory Visit: Payer: Self-pay | Admitting: Family Medicine
